# Patient Record
Sex: Male | Born: 1940 | Race: White | Hispanic: No | Marital: Married | Smoking: Never smoker
Health system: Southern US, Community
[De-identification: ages and names within clinical notes are randomized; demographics above are authoritative.]

## PROBLEM LIST (undated history)

## (undated) DIAGNOSIS — I2510 Atherosclerotic heart disease of native coronary artery without angina pectoris: Secondary | ICD-10-CM

## (undated) DIAGNOSIS — G4733 Obstructive sleep apnea (adult) (pediatric): Secondary | ICD-10-CM

## (undated) DIAGNOSIS — E538 Deficiency of other specified B group vitamins: Secondary | ICD-10-CM

## (undated) DIAGNOSIS — N529 Male erectile dysfunction, unspecified: Secondary | ICD-10-CM

## (undated) DIAGNOSIS — G930 Cerebral cysts: Secondary | ICD-10-CM

## (undated) DIAGNOSIS — I5189 Other ill-defined heart diseases: Secondary | ICD-10-CM

## (undated) DIAGNOSIS — K4090 Unilateral inguinal hernia, without obstruction or gangrene, not specified as recurrent: Secondary | ICD-10-CM

## (undated) DIAGNOSIS — I77810 Thoracic aortic ectasia: Secondary | ICD-10-CM

## (undated) DIAGNOSIS — D696 Thrombocytopenia, unspecified: Secondary | ICD-10-CM

## (undated) DIAGNOSIS — I10 Essential (primary) hypertension: Secondary | ICD-10-CM

## (undated) DIAGNOSIS — E785 Hyperlipidemia, unspecified: Secondary | ICD-10-CM

## (undated) DIAGNOSIS — I4729 Other ventricular tachycardia: Secondary | ICD-10-CM

## (undated) DIAGNOSIS — G8929 Other chronic pain: Secondary | ICD-10-CM

## (undated) DIAGNOSIS — R972 Elevated prostate specific antigen [PSA]: Secondary | ICD-10-CM

## (undated) DIAGNOSIS — K219 Gastro-esophageal reflux disease without esophagitis: Secondary | ICD-10-CM

## (undated) DIAGNOSIS — N400 Enlarged prostate without lower urinary tract symptoms: Secondary | ICD-10-CM

## (undated) DIAGNOSIS — I4891 Unspecified atrial fibrillation: Secondary | ICD-10-CM

## (undated) DIAGNOSIS — Z85828 Personal history of other malignant neoplasm of skin: Secondary | ICD-10-CM

## (undated) DIAGNOSIS — M1990 Unspecified osteoarthritis, unspecified site: Secondary | ICD-10-CM

## (undated) DIAGNOSIS — R519 Headache, unspecified: Secondary | ICD-10-CM

## (undated) HISTORY — DX: Personal history of other malignant neoplasm of skin: Z85.828

## (undated) HISTORY — PX: CARDIAC CATHETERIZATION: SHX172

## (undated) HISTORY — PX: HERNIA REPAIR: SHX51

## (undated) HISTORY — PX: OTHER SURGICAL HISTORY: SHX169

## (undated) HISTORY — PX: LEFT ATRIAL APPENDAGE OCCLUSION: SHX173A

---
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
## 2005-07-04 ENCOUNTER — Ambulatory Visit: Payer: Self-pay | Admitting: Gastroenterology

## 2009-03-19 ENCOUNTER — Ambulatory Visit: Payer: Self-pay | Admitting: Internal Medicine

## 2009-03-19 ENCOUNTER — Ambulatory Visit: Payer: Self-pay | Admitting: Surgery

## 2009-03-25 ENCOUNTER — Ambulatory Visit: Payer: Self-pay | Admitting: Surgery

## 2010-04-23 ENCOUNTER — Observation Stay: Payer: Self-pay | Admitting: Internal Medicine

## 2010-04-25 ENCOUNTER — Emergency Department: Payer: Self-pay | Admitting: Emergency Medicine

## 2011-11-10 ENCOUNTER — Ambulatory Visit: Payer: Self-pay | Admitting: Internal Medicine

## 2011-11-15 ENCOUNTER — Ambulatory Visit: Payer: Self-pay | Admitting: Unknown Physician Specialty

## 2011-11-15 LAB — CREATININE, SERUM
EGFR (African American): >60
EGFR (Non-African Amer.): >60

## 2011-11-21 ENCOUNTER — Ambulatory Visit: Payer: Self-pay | Admitting: Anesthesiology

## 2011-11-22 ENCOUNTER — Ambulatory Visit: Payer: Self-pay | Admitting: Unknown Physician Specialty

## 2011-11-24 LAB — PATHOLOGY REPORT

## 2011-11-29 ENCOUNTER — Ambulatory Visit: Payer: Self-pay | Admitting: Internal Medicine

## 2011-11-29 LAB — CREATININE, SERUM
Creatinine: 1.04 mg/dL (ref 0.60–1.30)
EGFR (African American): >60
EGFR (Non-African Amer.): >60

## 2012-02-19 ENCOUNTER — Emergency Department: Payer: Self-pay | Admitting: Emergency Medicine

## 2012-02-19 LAB — COMPREHENSIVE METABOLIC PANEL
Albumin: 4.6 g/dL (ref 3.4–5.0)
Calcium, Total: 9.4 mg/dL (ref 8.5–10.1)
Chloride: 103 mmol/L (ref 98–107)
Glucose: 104 mg/dL — ABNORMAL HIGH (ref 65–99)
Potassium: 3.4 mmol/L — ABNORMAL LOW (ref 3.5–5.1)
SGOT(AST): 31 U/L (ref 15–37)
SGPT (ALT): 35 U/L
Total Protein: 8.5 g/dL — ABNORMAL HIGH (ref 6.4–8.2)

## 2012-02-19 LAB — CBC
HCT: 47.6 % (ref 40.0–52.0)
HGB: 16.3 g/dL (ref 13.0–18.0)
MCH: 31.3 pg (ref 26.0–34.0)
RDW: 13 % (ref 11.5–14.5)
WBC: 7.8 10*3/uL (ref 3.8–10.6)

## 2012-02-20 ENCOUNTER — Ambulatory Visit: Payer: Self-pay | Admitting: Gastroenterology

## 2012-06-08 DIAGNOSIS — R972 Elevated prostate specific antigen [PSA]: Secondary | ICD-10-CM | POA: Insufficient documentation

## 2012-06-08 DIAGNOSIS — R3915 Urgency of urination: Secondary | ICD-10-CM | POA: Insufficient documentation

## 2012-06-11 DIAGNOSIS — D414 Neoplasm of uncertain behavior of bladder: Secondary | ICD-10-CM | POA: Insufficient documentation

## 2013-08-30 DIAGNOSIS — R6882 Decreased libido: Secondary | ICD-10-CM | POA: Insufficient documentation

## 2014-06-19 DIAGNOSIS — R339 Retention of urine, unspecified: Secondary | ICD-10-CM | POA: Insufficient documentation

## 2015-01-11 NOTE — Op Note (Signed)
PATIENT NAME:  Elijah Nelson, Elijah Nelson MR#:  [id number] DATE OF BIRTH:  August 23, 1941  DATE OF PROCEDURE:  11/22/2011  PREOPERATIVE DIAGNOSIS: Right lower neck supraclavicular mass.   POSTOPERATIVE DIAGNOSIS: Right lower neck supraclavicular mass.   OPERATION PERFORMED: Excision of right lower neck mass.   SURGEON: Roena Malady, M.D.   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 There was normal clavicular head to the sternocleidomastoid muscle.   DESCRIPTION OF PROCEDURE: Elijah Nelson was identified in the holding area and taken to the Operating Room and placed in the supine position. After laryngeal mask anesthesia, the neck was prepped and draped sterilely. An incision line was marked over the supraclavicular mass. A local anesthetic of 1% lidocaine with 1:100,000 epinephrine was used to inject over the area. A total of 1.5 to 2 mL was used. With the neck prepped and draped sterilely, a 15 blade was used to incise down to and through the platysma muscle. Hemostasis was achieved using the Bovie cautery. There was a small fatty tumor there consistent with a lipoma, which was excised in its entirety. Once this was removed, careful palpation beneath this did show what felt like a hypertrophy of the clavicular head and sternocleidomastoid muscle. With gentle dissection into the belly of the muscle I could see no evidence of tumor or mass. The muscle appeared normal. Dr. Mauri Pole who had also reviewed his films came in, palpated the area with me and agreed there was no further mass identifiable. The wound was then copiously irrigated. Reinspection again showed no evidence of residual palpable mass. The subcutaneous tissues were then closed using interrupted 4-0 Vicryl, and the skin was closed using Dermabond. The patient was returned to anesthesia where he was awakened in the Operating Room and taken to the Recovery Room in stable condition.   CULTURES: None.        SPECIMEN: Probable lipoma.   ESTIMATED BLOOD LOSS: Less than 5 mL. ____________________________ Roena Malady, MD ctm:slb D: 11/22/2011 14:38:24 ET T: 11/22/2011 16:25:38 ET JOB#: [id number]  cc: Roena Malady, MD, <Dictator> Roena Malady MD ELECTRONICALLY SIGNED 12/16/2011 8:38

## 2015-07-10 ENCOUNTER — Ambulatory Visit: Payer: Medicare Other | Attending: Neurology

## 2015-07-10 DIAGNOSIS — G4733 Obstructive sleep apnea (adult) (pediatric): Secondary | ICD-10-CM | POA: Insufficient documentation

## 2016-02-17 ENCOUNTER — Ambulatory Visit: Payer: Medicare Other | Attending: Neurology

## 2016-02-17 DIAGNOSIS — G4733 Obstructive sleep apnea (adult) (pediatric): Secondary | ICD-10-CM | POA: Insufficient documentation

## 2016-12-05 DIAGNOSIS — E538 Deficiency of other specified B group vitamins: Secondary | ICD-10-CM | POA: Insufficient documentation

## 2016-12-05 DIAGNOSIS — I10 Essential (primary) hypertension: Secondary | ICD-10-CM | POA: Insufficient documentation

## 2017-09-18 ENCOUNTER — Encounter: Payer: Self-pay | Admitting: Emergency Medicine

## 2017-09-18 ENCOUNTER — Emergency Department: Payer: Medicare Other

## 2017-09-18 ENCOUNTER — Observation Stay
Admission: EM | Admit: 2017-09-18 | Discharge: 2017-09-19 | Disposition: A | Discharge status: Home / Self Care | Payer: Medicare Other | Attending: Internal Medicine | Admitting: Internal Medicine

## 2017-09-18 DIAGNOSIS — G4733 Obstructive sleep apnea (adult) (pediatric): Secondary | ICD-10-CM | POA: Diagnosis not present

## 2017-09-18 DIAGNOSIS — K219 Gastro-esophageal reflux disease without esophagitis: Secondary | ICD-10-CM | POA: Diagnosis present

## 2017-09-18 DIAGNOSIS — I4891 Unspecified atrial fibrillation: Secondary | ICD-10-CM | POA: Diagnosis present

## 2017-09-18 DIAGNOSIS — E785 Hyperlipidemia, unspecified: Secondary | ICD-10-CM | POA: Insufficient documentation

## 2017-09-18 DIAGNOSIS — Z79899 Other long term (current) drug therapy: Secondary | ICD-10-CM | POA: Insufficient documentation

## 2017-09-18 DIAGNOSIS — R0789 Other chest pain: Secondary | ICD-10-CM | POA: Insufficient documentation

## 2017-09-18 DIAGNOSIS — I10 Essential (primary) hypertension: Secondary | ICD-10-CM | POA: Diagnosis not present

## 2017-09-18 DIAGNOSIS — R079 Chest pain, unspecified: Secondary | ICD-10-CM

## 2017-09-18 DIAGNOSIS — R51 Headache: Secondary | ICD-10-CM | POA: Diagnosis not present

## 2017-09-18 HISTORY — DX: Gastro-esophageal reflux disease without esophagitis: K21.9

## 2017-09-18 HISTORY — DX: Unspecified atrial fibrillation: I48.91

## 2017-09-18 HISTORY — DX: Obstructive sleep apnea (adult) (pediatric): G47.33

## 2017-09-18 LAB — BASIC METABOLIC PANEL
Anion gap: 10 (ref 5–15)
BUN: 12 mg/dL (ref 6–20)
CHLORIDE: 104 mmol/L (ref 101–111)
CO2: 26 mmol/L (ref 22–32)
CREATININE: 0.87 mg/dL (ref 0.61–1.24)
Calcium: 9 mg/dL (ref 8.9–10.3)
GFR calc non Af Amer: >60 mL/min (ref >60)
Glucose, Bld: 103 mg/dL — ABNORMAL HIGH (ref 65–99)
POTASSIUM: 3.6 mmol/L (ref 3.5–5.1)
SODIUM: 140 mmol/L (ref 135–145)

## 2017-09-18 LAB — CBC
HCT: 42.8 % (ref 40.0–52.0)
Hemoglobin: 14.6 g/dL (ref 13.0–18.0)
MCH: 31.9 pg (ref 26.0–34.0)
MCHC: 34.2 g/dL (ref 32.0–36.0)
MCV: 93.3 fL (ref 80.0–100.0)
PLATELETS: 162 10*3/uL (ref 150–440)
RBC: 4.59 MIL/uL (ref 4.40–5.90)
RDW: 14.1 % (ref 11.5–14.5)
WBC: 8 10*3/uL (ref 3.8–10.6)

## 2017-09-18 LAB — TROPONIN I: Troponin I: <0.03 ng/mL (ref <0.03)

## 2017-09-18 MED ORDER — DILTIAZEM HCL 25 MG/5ML IV SOLN
15.0000 mg | Freq: Once | INTRAVENOUS | Status: AC
  Administered 2017-09-18: 15 mg via INTRAVENOUS
  Filled 2017-09-18: qty 5

## 2017-09-18 NOTE — ED Notes (Signed)
X-ray at bedside

## 2017-09-18 NOTE — ED Provider Notes (Signed)
[facility] Emergency Department Provider Note  ____________________________________________   I have reviewed the triage vital signs and the nursing notes.   HISTORY  Chief Complaint Chest Pain   History limited by: Not Limited   HPI Elijah Nelson is a 76 y.o. male who presents to the emergency department today because of concern for chest pain.   LOCATION:center upper chest DURATION:3 days TIMING: intermittent QUALITY: pressure CONTEXT: patient states that for the past three days he has been having chest pressure. Located in the upper chest. Thought initially it might be heartburn given poor diet around the holidays. Today however noted that his pulse was fast and irregular. States he had history of afib roughly 10 years ago. Has not had any problem since then. No new medications. MODIFYING FACTORS: none ASSOCIATED SYMPTOMS: pain radiates to shoulders  Per medical record review patient has a history of GERD  Past Medical History:  Diagnosis Date  . Atrial fibrillation ([place])   . GERD (gastroesophageal reflux disease)   . OSA (obstructive sleep apnea)     There are no active problems to display for this patient.   Past Surgical History:  Procedure Laterality Date  . HERNIA REPAIR      Prior to Admission medications   Medication Sig Start Date End Date Taking? Authorizing Provider  tamsulosin (FLOMAX) 0.4 MG CAPS capsule Take 0.4 mg by mouth daily. 07/21/17   [provider]    Allergies Patient has no known allergies.  History reviewed. No pertinent family history.  Social History Social History   Tobacco Use  . Smoking status: Never Smoker  . Smokeless tobacco: Never Used  Substance Use Topics  . Alcohol use: No    Frequency: Never  . Drug use: Not on file    Review of Systems Constitutional: No fever/chills Eyes: No visual changes. ENT: No sore throat. Cardiovascular: Positive for chest pain. Respiratory:  Denies shortness of breath. Gastrointestinal: No abdominal pain.  No nausea, no vomiting.  No diarrhea.   Genitourinary: Negative for dysuria. Musculoskeletal: Negative for back pain. Skin: Negative for rash. Neurological: Positive for chronic headaches.  ____________________________________________   PHYSICAL EXAM:  VITAL SIGNS: ED Triage Vitals  Enc Vitals Group     BP 09/18/17 2204 (!) 133/93     Pulse Rate 09/18/17 2204 (!) 128     Resp 09/18/17 2204 17     Temp 09/18/17 2204 98.1 F (36.7 C)     Temp Source 09/18/17 2204 Oral     SpO2 09/18/17 2204 97 %     Weight 09/18/17 2205 204 lb (92.5 kg)     Height 09/18/17 2205 6\' 3"  (1.905 m)    Constitutional: Alert and oriented. Well appearing and in no distress. Eyes: Conjunctivae are normal.  ENT   Head: Normocephalic and atraumatic.   Nose: No congestion/rhinnorhea.   Mouth/Throat: Mucous membranes are moist.   Neck: No stridor. Hematological/Lymphatic/Immunilogical: No cervical lymphadenopathy. Cardiovascular: Tachycardic. Irregularly irregular rhythm.  No murmurs, rubs, or gallops. Respiratory: Normal respiratory effort without tachypnea nor retractions. Breath sounds are clear and equal bilaterally. No wheezes/rales/rhonchi. Gastrointestinal: Soft and non tender. No rebound. No guarding.  Genitourinary: Deferred Musculoskeletal: Normal range of motion in all extremities. No lower extremity edema. Neurologic:  Normal speech and language. No gross focal neurologic deficits are appreciated.  Skin:  Skin is warm, dry and intact. No rash noted. Psychiatric: Mood and affect are normal. Speech and behavior are normal. Patient exhibits appropriate insight and judgment.  ____________________________________________    LABS (pertinent positives/negatives)  Trop <0.03 CBC wnl BMP wnl except glu 103  ____________________________________________   EKG  I, Nance Pear, attending physician, personally  viewed and interpreted this EKG  EKG Time: 2201 Rate: 128 Rhythm: atrial fibrillation with RVR Axis: normal Intervals: qtc 449 QRS: narrow ST changes: no st elevation Impression: abnormal ekg  ____________________________________________    RADIOLOGY  CXR No acute disease  ____________________________________________   PROCEDURES  Procedures  ____________________________________________   INITIAL IMPRESSION / ASSESSMENT AND PLAN / ED COURSE  Pertinent labs & imaging results that were available during my care of the patient were reviewed by me and considered in my medical decision making (see chart for details).  Patient presented to the emergency department today because of concerns for chest pressure.  Differential would be broad including cardiac disease, arrhythmia, pneumonia, pneumothorax amongst other etiologies.  EKG does show atrial fibrillation with RVR.  Patient states he did have a distant history of atrial fibrillation.  Blood work does not show any signs of cardiac ischemia.  Electrolytes within normal limits.  Patient's heart rate did improve after diltiazem.  Will plan on admission.  Discussed findings and plan with patient.   ____________________________________________   FINAL CLINICAL IMPRESSION(S) / ED DIAGNOSES  Final diagnoses:  Chest pain, unspecified type  Atrial fibrillation with rapid ventricular response ([place])     Note: This dictation was prepared with Dragon dictation. Any transcriptional errors that result from this process are unintentional     Nance Pear, MD 09/18/17 [phone]

## 2017-09-18 NOTE — ED Notes (Signed)
Pt states chest pressure x 3 days. States central chest, c/o bilat neck pain and shoulder pain. Alert, oriented, ambulatory around room. Wife took VS at home and found HR to be irregular.

## 2017-09-18 NOTE — ED Triage Notes (Addendum)
Pt c/o intermittent central chest pressure x3 days that radiates into both sides of neck and shoulders. Pt denies cardiac hx. Pt denies SOB, N/V. EKG show a-fib RVR

## 2017-09-19 ENCOUNTER — Other Ambulatory Visit: Payer: Self-pay

## 2017-09-19 DIAGNOSIS — I4891 Unspecified atrial fibrillation: Secondary | ICD-10-CM | POA: Diagnosis not present

## 2017-09-19 DIAGNOSIS — R0789 Other chest pain: Secondary | ICD-10-CM | POA: Diagnosis not present

## 2017-09-19 DIAGNOSIS — R51 Headache: Secondary | ICD-10-CM | POA: Diagnosis not present

## 2017-09-19 DIAGNOSIS — E785 Hyperlipidemia, unspecified: Secondary | ICD-10-CM | POA: Diagnosis not present

## 2017-09-19 DIAGNOSIS — Z79899 Other long term (current) drug therapy: Secondary | ICD-10-CM | POA: Diagnosis not present

## 2017-09-19 DIAGNOSIS — G4733 Obstructive sleep apnea (adult) (pediatric): Secondary | ICD-10-CM | POA: Diagnosis not present

## 2017-09-19 DIAGNOSIS — K219 Gastro-esophageal reflux disease without esophagitis: Secondary | ICD-10-CM | POA: Diagnosis not present

## 2017-09-19 DIAGNOSIS — I10 Essential (primary) hypertension: Secondary | ICD-10-CM | POA: Diagnosis not present

## 2017-09-19 LAB — BASIC METABOLIC PANEL
ANION GAP: 7 (ref 5–15)
BUN: 12 mg/dL (ref 6–20)
CO2: 25 mmol/L (ref 22–32)
Calcium: 8.8 mg/dL — ABNORMAL LOW (ref 8.9–10.3)
Chloride: 106 mmol/L (ref 101–111)
Creatinine, Ser: 0.95 mg/dL (ref 0.61–1.24)
Glucose, Bld: 108 mg/dL — ABNORMAL HIGH (ref 65–99)
POTASSIUM: 3.8 mmol/L (ref 3.5–5.1)
SODIUM: 138 mmol/L (ref 135–145)

## 2017-09-19 LAB — CBC
HEMATOCRIT: 42.8 % (ref 40.0–52.0)
HEMOGLOBIN: 14.6 g/dL (ref 13.0–18.0)
MCH: 31.6 pg (ref 26.0–34.0)
MCHC: 34.1 g/dL (ref 32.0–36.0)
MCV: 92.7 fL (ref 80.0–100.0)
Platelets: 147 10*3/uL — ABNORMAL LOW (ref 150–440)
RBC: 4.62 MIL/uL (ref 4.40–5.90)
RDW: 13.8 % (ref 11.5–14.5)
WBC: 6.8 10*3/uL (ref 3.8–10.6)

## 2017-09-19 LAB — TROPONIN I: Troponin I: <0.03 ng/mL (ref <0.03)

## 2017-09-19 MED ORDER — APIXABAN 5 MG PO TABS
5.0000 mg | ORAL_TABLET | Freq: Two times a day (BID) | ORAL | Status: DC
  Administered 2017-09-19: 5 mg via ORAL
  Filled 2017-09-19: qty 1

## 2017-09-19 MED ORDER — ONDANSETRON HCL 4 MG/2ML IJ SOLN: 4.0000 mg | Freq: Four times a day (QID) | INTRAMUSCULAR | Status: DC | PRN

## 2017-09-19 MED ORDER — TAMSULOSIN HCL 0.4 MG PO CAPS
0.4000 mg | ORAL_CAPSULE | Freq: Every day | ORAL | Status: DC
  Administered 2017-09-19: 0.4 mg via ORAL
  Filled 2017-09-19: qty 1

## 2017-09-19 MED ORDER — APIXABAN 5 MG PO TABS: 5.0000 mg | ORAL_TABLET | Freq: Two times a day (BID) | ORAL | 0 refills | Status: DC

## 2017-09-19 MED ORDER — DILTIAZEM HCL 30 MG PO TABS
30.0000 mg | ORAL_TABLET | Freq: Three times a day (TID) | ORAL | Status: DC
  Administered 2017-09-19: 30 mg via ORAL
  Filled 2017-09-19: qty 1

## 2017-09-19 MED ORDER — DILTIAZEM HCL 30 MG PO TABS: 30.0000 mg | ORAL_TABLET | Freq: Three times a day (TID) | ORAL | Status: DC

## 2017-09-19 MED ORDER — SODIUM CHLORIDE 0.9% FLUSH
3.0000 mL | Freq: Two times a day (BID) | INTRAVENOUS | Status: DC
  Administered 2017-09-19: 3 mL via INTRAVENOUS

## 2017-09-19 MED ORDER — PANTOPRAZOLE SODIUM 40 MG PO TBEC
40.0000 mg | DELAYED_RELEASE_TABLET | Freq: Every day | ORAL | Status: DC
  Administered 2017-09-19: 40 mg via ORAL
  Filled 2017-09-19: qty 1

## 2017-09-19 MED ORDER — ONDANSETRON HCL 4 MG PO TABS: 4.0000 mg | ORAL_TABLET | Freq: Four times a day (QID) | ORAL | Status: DC | PRN

## 2017-09-19 MED ORDER — ACETAMINOPHEN 325 MG PO TABS: 650.0000 mg | ORAL_TABLET | Freq: Four times a day (QID) | ORAL | Status: DC | PRN

## 2017-09-19 MED ORDER — ACETAMINOPHEN 650 MG RE SUPP: 650.0000 mg | Freq: Four times a day (QID) | RECTAL | Status: DC | PRN

## 2017-09-19 MED ORDER — ENOXAPARIN SODIUM 40 MG/0.4ML ~~LOC~~ SOLN
40.0000 mg | SUBCUTANEOUS | Status: DC
  Administered 2017-09-19: 40 mg via SUBCUTANEOUS
  Filled 2017-09-19: qty 0.4

## 2017-09-19 MED ORDER — DILTIAZEM HCL ER COATED BEADS 180 MG PO CP24
180.0000 mg | ORAL_CAPSULE | Freq: Every day | ORAL | Status: DC
  Administered 2017-09-19: 180 mg via ORAL
  Filled 2017-09-19: qty 1

## 2017-09-19 MED ORDER — DILTIAZEM HCL ER COATED BEADS 180 MG PO CP24: 180.0000 mg | ORAL_CAPSULE | Freq: Every day | ORAL | 0 refills | Status: DC

## 2017-09-19 MED ORDER — ASPIRIN EC 81 MG PO TBEC
81.0000 mg | DELAYED_RELEASE_TABLET | Freq: Every day | ORAL | Status: DC
  Administered 2017-09-19: 81 mg via ORAL
  Filled 2017-09-19: qty 1

## 2017-09-19 MED ORDER — SIMVASTATIN 20 MG PO TABS
40.0000 mg | ORAL_TABLET | Freq: Every day | ORAL | Status: DC
  Administered 2017-09-19: 40 mg via ORAL
  Filled 2017-09-19: qty 2

## 2017-09-19 NOTE — Care Management (Signed)
Provided with Eliquis coupon.  Patient verbally confirms that he has pharmacy coverage with his medicare policy

## 2017-09-19 NOTE — Progress Notes (Signed)
Admitted for atrial fibrillation with RVR, discharging home today with Eliquis, Cardizem 180 mg daily, discussed with Dr. Clayborn Bigness, patient feels better and wants to go home.  Discharge medications are called into [facility].

## 2017-09-19 NOTE — Plan of Care (Signed)
  Progressing Education: Knowledge of General Education information will improve 09/19/2017 0410 - Progressing by Jeri Cos, RN Clinical Measurements: Ability to maintain clinical measurements within normal limits will improve 09/19/2017 0410 - Progressing by Jeri Cos, RN Will remain free from infection 09/19/2017 0410 - Progressing by Jeri Cos, RN Diagnostic test results will improve 09/19/2017 0410 - Progressing by Jeri Cos, RN Activity: Risk for activity intolerance will decrease 09/19/2017 0410 - Progressing by Jeri Cos, RN Nutrition: Adequate nutrition will be maintained 09/19/2017 0410 - Progressing by Jeri Cos, RN Pain Managment: General experience of comfort will improve 09/19/2017 0410 - Progressing by Jeri Cos, RN Safety: Ability to remain free from injury will improve 09/19/2017 0410 - Progressing by Jeri Cos, RN Education: Knowledge of disease or condition will improve 09/19/2017 0410 - Progressing by Jeri Cos, RN Understanding of medication regimen will improve 09/19/2017 0410 - Progressing by Jeri Cos, RN Cardiac: Ability to achieve and maintain adequate cardiopulmonary perfusion will improve 09/19/2017 0410 - Progressing by Jeri Cos, RN

## 2017-09-19 NOTE — Care Management Obs Status (Signed)
[facility] NOTIFICATION   Patient Details  Name: Elijah Nelson MRN: [id number] Date of Birth: [birth year]   Medicare Observation Status Notification Given:  No Discharge order placed in < 24hr of being placed on observation   Katrina Stack, RN 09/19/2017, 11:26 AM

## 2017-09-19 NOTE — Consult Note (Signed)
Reason for Consult: Atrial fibrillation angina Referring Physician: Dr. Kerrin Mo primary. Hospitalist Anselm Jungling  Elijah Nelson is an 77 y.o. male.  HPI: 77 year old white male history of headaches hypertension obstructive sleep apnea states to have had recurrent chest pain possible anginal symptoms over the last several weeks to days.  Patient got progressively worse felt poorly after prompting from his family finally sought medical attention and was found to be in rapid atrial fibrillation.  Patient had noted palpitations and tachycardia no previous history except for may be 8 years ago after a insect bite.  Patient has done relatively well recently except for persistent recurrent headaches on this admission he was found to have atrial fibrillation he complained of midsternal chest discomfort pressure burning sensation radiating to the back.  Denies any recent cardiac evaluation.  Last workup was about 8 years ago patient now here for further assessment chest pain is somewhat better atrial fibrillation rate controlled  Past Medical History:  Diagnosis Date  . Atrial fibrillation ([place])   . GERD (gastroesophageal reflux disease)   . OSA (obstructive sleep apnea)     Past Surgical History:  Procedure Laterality Date  . HERNIA REPAIR      Family History  Problem Relation Age of Onset  . Breast cancer Mother   . Heart attack Father   . Heart disease Father     Social History:  reports that  has never smoked. he has never used smokeless tobacco. He reports that he drinks about 2.4 oz of alcohol per week. He reports that he does not use drugs.  Allergies: No Known Allergies  Medications: Please see medication list  Results for orders placed or performed during the hospital encounter of 09/18/17 (from the past 48 hour(s))  Basic metabolic panel     Status: Abnormal   Collection Time: 09/18/17 10:00 PM  Result Value Ref Range   Sodium 140 135 - 145 mmol/L   Potassium 3.6 3.5 - 5.1  mmol/L   Chloride 104 101 - 111 mmol/L   CO2 26 22 - 32 mmol/L   Glucose, Bld 103 (H) 65 - 99 mg/dL   BUN 12 6 - 20 mg/dL   Creatinine, Ser 0.87 0.61 - 1.24 mg/dL   Calcium 9.0 8.9 - 10.3 mg/dL   GFR calc non Af Amer >60 >60 mL/min   GFR calc Af Amer >60 >60 mL/min    Comment: (NOTE) The eGFR has been calculated using the CKD EPI equation. This calculation has not been validated in all clinical situations. eGFR's persistently <60 mL/min signify possible Chronic Kidney Disease.    Anion gap 10 5 - 15    Comment: Performed at [facility], [address] [place], [place] [address]  CBC     Status: None   Collection Time: 09/18/17 10:00 PM  Result Value Ref Range   WBC 8.0 3.8 - 10.6 K/uL   RBC 4.59 4.40 - 5.90 MIL/uL   Hemoglobin 14.6 13.0 - 18.0 g/dL   HCT 42.8 40.0 - 52.0 %   MCV 93.3 80.0 - 100.0 fL   MCH 31.9 26.0 - 34.0 pg   MCHC 34.2 32.0 - 36.0 g/dL   RDW 14.1 11.5 - 14.5 %   Platelets 162 150 - 440 K/uL    Comment: Performed at [facility], [address] [place], [place] [address]  Troponin I     Status: None   Collection Time: 09/18/17 10:00 PM  Result Value Ref Range  Troponin I <0.03 <0.03 ng/mL    Comment: Performed at [facility], [address] [place], [place] [address]  Troponin I     Status: None   Collection Time: 09/19/17  1:44 AM  Result Value Ref Range   Troponin I <0.03 <0.03 ng/mL    Comment: Performed at [facility], [address] [place], [place] [address]  Troponin I     Status: None   Collection Time: 09/19/17  7:28 AM  Result Value Ref Range   Troponin I <0.03 <0.03 ng/mL    Comment: Performed at [facility], [address] [place], [place] [address]  Basic metabolic panel     Status: Abnormal   Collection Time: 09/19/17  7:28 AM  Result Value Ref Range   Sodium 138 135 - 145 mmol/L   Potassium 3.8 3.5 - 5.1 mmol/L   Chloride 106 101 - 111 mmol/L   CO2 25 22 - 32 mmol/L    Glucose, Bld 108 (H) 65 - 99 mg/dL   BUN 12 6 - 20 mg/dL   Creatinine, Ser 0.95 0.61 - 1.24 mg/dL   Calcium 8.8 (L) 8.9 - 10.3 mg/dL   GFR calc non Af Amer >60 >60 mL/min   GFR calc Af Amer >60 >60 mL/min    Comment: (NOTE) The eGFR has been calculated using the CKD EPI equation. This calculation has not been validated in all clinical situations. eGFR's persistently <60 mL/min signify possible Chronic Kidney Disease.    Anion gap 7 5 - 15    Comment: Performed at [facility], [address] [place], [place] [address]  CBC     Status: Abnormal   Collection Time: 09/19/17  7:28 AM  Result Value Ref Range   WBC 6.8 3.8 - 10.6 K/uL   RBC 4.62 4.40 - 5.90 MIL/uL   Hemoglobin 14.6 13.0 - 18.0 g/dL   HCT 42.8 40.0 - 52.0 %   MCV 92.7 80.0 - 100.0 fL   MCH 31.6 26.0 - 34.0 pg   MCHC 34.1 32.0 - 36.0 g/dL   RDW 13.8 11.5 - 14.5 %   Platelets 147 (L) 150 - 440 K/uL    Comment: Performed at [facility], [address], [place], [place] [address]    Dg Chest Port 1 View  Result Date: 09/18/2017 CLINICAL DATA:  Chest pressure x3 days. EXAM: PORTABLE CHEST 1 VIEW COMPARISON:  04/25/2010 FINDINGS: Borderline cardiomegaly with slight uncoiling of the thoracic aorta. No pneumonic consolidation or CHF. No effusion or pneumothorax. Mild degenerate change of the AC joints. No acute nor suspicious osseous abnormalities. IMPRESSION: No active disease. Electronically Signed   By: Ashley Royalty M.D.   On: 09/18/2017 22:24    Review of Systems  Constitutional: Positive for malaise/fatigue.  HENT: Negative.   Eyes: Negative.   Respiratory: Positive for shortness of breath.   Cardiovascular: Positive for chest pain and palpitations.  Gastrointestinal: Positive for heartburn.  Genitourinary: Negative.   Musculoskeletal: Negative.   Skin: Negative.   Neurological: Positive for dizziness, weakness and headaches.  Endo/Heme/Allergies: Negative.   Psychiatric/Behavioral: Negative.     Blood pressure 119/72, pulse 99, temperature 98.2 F (36.8 C), temperature source Oral, resp. rate 14, height _0  (1.905 m), weight 209 lb 11.2 oz (95.1 kg), SpO2 96 %. Physical Exam  Nursing note and vitals reviewed. Constitutional: He is oriented to person, place, and time. He appears well-developed and well-nourished.  HENT:  Head: Normocephalic and atraumatic.  Eyes: Conjunctivae and EOM are normal. Pupils are equal, round, and reactive to light.  Neck: Normal range of motion. Neck supple.  Cardiovascular: S1 normal, S2 normal and normal pulses. An irregularly irregular rhythm present. Tachycardia present. Exam reveals gallop and S3.  Murmur heard.  Systolic murmur is present with a grade of 2/6. Respiratory: Effort normal. He has wheezes.  GI: Soft. Bowel sounds are normal.  Musculoskeletal: Normal range of motion.  Neurological: He is alert and oriented to person, place, and time. He has normal reflexes.  Skin: Skin is warm and dry.  Psychiatric: He has a normal mood and affect.    Assessment/Plan: Chest pain Possible angina Atrial fibrillation Hypertension Tachycardia GERD Obstructive sleep apnea Headaches . PLAN Agree with admit to telemetry Rule out myocardial infarction with enzymes and EKG Agree with diltiazem for heart rate and blood pressure control Increase dose to 180 mg daily discontinue short acting Cardizem Hyperlipidemia maintain simvastatin therapy for lipid management Agree with Protonix therapy for reflux symptoms Recommend anticoagulation with Eliquis twice a day because of A. Fib Recommend echocardiogram for further assessment evaluation proxy was as an outpatient Functional study for chest pain and angina can be done as an outpatient We will avoid nitrates because of headaches Sleep study CPAP for obstructive sleep apnea    Elijah Nelson 09/19/2017, 2:38 PM

## 2017-09-19 NOTE — H&P (Signed)
[facility] NAME: Elijah Nelson    MR#:  [id number]  DATE OF BIRTH:  05-15-1941  DATE OF ADMISSION:  09/18/2017  PRIMARY CARE PHYSICIAN: Katheren Shams   REQUESTING/REFERRING PHYSICIAN: Archie Balboa, MD  CHIEF COMPLAINT:   Chief Complaint  Patient presents with  . Chest Pain    HISTORY OF PRESENT ILLNESS:  Elijah Nelson  is a 77 y.o. male who presents with couple days of intermittent chest heaviness.  Patient presented to the ED with atrial fibrillation and RVR.  He states he had an episode of A. fib about 8 years ago when he was in the hospital.  He has not had any issues or sternal symptoms since that time.  Initial workup here in the ED is largely within normal limits other than the A. fib.  His A. fib was initially fairly responsive to nodal blocking agents.  Hospitalist were called for admission  PAST MEDICAL HISTORY:   Past Medical History:  Diagnosis Date  . Atrial fibrillation ([place])   . GERD (gastroesophageal reflux disease)   . OSA (obstructive sleep apnea)     PAST SURGICAL HISTORY:   Past Surgical History:  Procedure Laterality Date  . HERNIA REPAIR      SOCIAL HISTORY:   Social History   Tobacco Use  . Smoking status: Never Smoker  . Smokeless tobacco: Never Used  Substance Use Topics  . Alcohol use: Yes    Alcohol/week: 2.4 oz    Types: 4 Glasses of wine per week    Frequency: Never    FAMILY HISTORY:   Family History  Problem Relation Age of Onset  . Breast cancer Mother   . Heart attack Father   . Heart disease Father     DRUG ALLERGIES:  No Known Allergies  MEDICATIONS AT HOME:   Prior to Admission medications   Medication Sig Start Date End Date Taking? Authorizing Provider  tamsulosin (FLOMAX) 0.4 MG CAPS capsule Take 0.4 mg by mouth daily. 07/21/17   [provider]    REVIEW OF SYSTEMS:  Review of Systems  Constitutional: Negative for chills, fever,  malaise/fatigue and weight loss.  HENT: Negative for ear pain, hearing loss and tinnitus.   Eyes: Negative for blurred vision, double vision, pain and redness.  Respiratory: Negative for cough, hemoptysis and shortness of breath.   Cardiovascular: Positive for chest pain. Negative for palpitations, orthopnea and leg swelling.  Gastrointestinal: Negative for abdominal pain, constipation, diarrhea, nausea and vomiting.  Genitourinary: Negative for dysuria, frequency and hematuria.  Musculoskeletal: Negative for back pain, joint pain and neck pain.  Skin:       No acne, rash, or lesions  Neurological: Negative for dizziness, tremors, focal weakness and weakness.  Endo/Heme/Allergies: Negative for polydipsia. Does not bruise/bleed easily.  Psychiatric/Behavioral: Negative for depression. The patient is not nervous/anxious and does not have insomnia.      VITAL SIGNS:   Vitals:   09/18/17 2204 09/18/17 2205 09/18/17 2213 09/18/17 2247  BP: (!) 133/93  119/78 120/88  Pulse: (!) 128  (!) 123 (!) 117  Resp: 17  17 (!) 22  Temp: 98.1 F (36.7 C)     TempSrc: Oral     SpO2: 97%  94% 92%  Weight:  92.5 kg (204 lb)    Height:  6\' 3"  (1.905 m)     Wt Readings from Last 3 Encounters:  09/18/17 92.5 kg (204 lb)    PHYSICAL EXAMINATION:  Physical Exam  Vitals reviewed. Constitutional: He is oriented to person, place, and time. He appears well-developed and well-nourished. No distress.  HENT:  Head: Normocephalic and atraumatic.  Mouth/Throat: Oropharynx is clear and moist.  Eyes: Conjunctivae and EOM are normal. Pupils are equal, round, and reactive to light. No scleral icterus.  Neck: Normal range of motion. Neck supple. No JVD present. No thyromegaly present.  Cardiovascular: Intact distal pulses. Exam reveals no gallop and no friction rub.  No murmur heard. Tachycardic, irregular rhythm  Respiratory: Effort normal and breath sounds normal. No respiratory distress. He has no wheezes.  He has no rales.  GI: Soft. Bowel sounds are normal. He exhibits no distension. There is no tenderness.  Musculoskeletal: Normal range of motion. He exhibits no edema.  No arthritis, no gout  Lymphadenopathy:    He has no cervical adenopathy.  Neurological: He is alert and oriented to person, place, and time. No cranial nerve deficit.  No dysarthria, no aphasia  Skin: Skin is warm and dry. No rash noted. No erythema.  Psychiatric: He has a normal mood and affect. His behavior is normal. Judgment and thought content normal.    LABORATORY PANEL:   CBC Recent Labs  Lab 09/18/17 2200  WBC 8.0  HGB 14.6  HCT 42.8  PLT 162   ------------------------------------------------------------------------------------------------------------------  Chemistries  Recent Labs  Lab 09/18/17 2200  NA 140  K 3.6  CL 104  CO2 26  GLUCOSE 103*  BUN 12  CREATININE 0.87  CALCIUM 9.0   ------------------------------------------------------------------------------------------------------------------  Cardiac Enzymes Recent Labs  Lab 09/18/17 2200  TROPONINI <0.03   ------------------------------------------------------------------------------------------------------------------  RADIOLOGY:  Dg Chest Port 1 View  Result Date: 09/18/2017 CLINICAL DATA:  Chest pressure x3 days. EXAM: PORTABLE CHEST 1 VIEW COMPARISON:  04/25/2010 FINDINGS: Borderline cardiomegaly with slight uncoiling of the thoracic aorta. No pneumonic consolidation or CHF. No effusion or pneumothorax. Mild degenerate change of the AC joints. No acute nor suspicious osseous abnormalities. IMPRESSION: No active disease. Electronically Signed   By: Ashley Royalty M.D.   On: 09/18/2017 22:24    EKG:   Orders placed or performed during the hospital encounter of 09/18/17  . EKG 12-Lead  . EKG 12-Lead  . EKG 12-Lead  . EKG 12-Lead  . ED EKG within 10 minutes  . ED EKG within 10 minutes    IMPRESSION AND PLAN:  Principal  Problem:   Atrial fibrillation with RVR (HCC) -will use nodal blocking agents tonight to get his rate down under 110.  Trend his cardiac enzymes, get an echocardiogram and a cardiology consult Active Problems:   Chest pain -see workup above   GERD (gastroesophageal reflux disease) -home dose PPI   OSA (obstructive sleep apnea) -CPAP nightly  All the records are reviewed and case discussed with ED provider. Management plans discussed with the patient and/or family.  DVT PROPHYLAXIS: SubQ lovenox  GI PROPHYLAXIS: PPI  ADMISSION STATUS: Observation  CODE STATUS: Full Code Status History    This patient does not have a recorded code status. Please follow your organizational policy for patients in this situation.      TOTAL TIME TAKING CARE OF THIS PATIENT: 40 minutes.   Towanna Avery [place] 09/19/2017, 12:17 AM  Elijah Nelson  Office  [phone]  CC: Primary care physician; Katheren Shams  Note:  This document was prepared using Dragon voice recognition software and may include unintentional dictation errors.

## 2017-09-19 NOTE — Plan of Care (Signed)
He and his wife are aware of follow up care.  And express compliance with the plan.

## 2017-09-24 NOTE — Discharge Summary (Signed)
Elijah Nelson, is a 77 y.o. male  DOB [birth year]  MRN [id number].  Admission date:  09/18/2017  Admitting Physician  Lance Coon, MD  Discharge Date:  09/19/2017   Primary MD  Katheren Shams  Recommendations for primary care physician for things to follow:   Follow-up with PCP in 1 week Follow-up with Dr. Clayborn Bigness from cardiology in 1-2 days   Admission Diagnosis  Atrial fibrillation with rapid ventricular response ([place]) [I48.91] Chest pain [R07.9] Chest pain, unspecified type [R07.9]   Discharge Diagnosis  Atrial fibrillation with rapid ventricular response ([place]) [I48.91] Chest pain [R07.9] Chest pain, unspecified type [R07.9]    Principal Problem:   Atrial fibrillation with RVR (HCC) Active Problems:   Chest pain   GERD (gastroesophageal reflux disease)   OSA (obstructive sleep apnea)      Past Medical History:  Diagnosis Date  . Atrial fibrillation ([place])   . GERD (gastroesophageal reflux disease)   . OSA (obstructive sleep apnea)     Past Surgical History:  Procedure Laterality Date  . HERNIA REPAIR         History of present illness and  Hospital Course:     Kindly see H&P for history of present illness and admission details, please review complete Labs, Consult reports and Test reports for all details in brief  HPI  from the history and physical done on the day of admission Elijah Nelson  is a 77 y.o. male who presents with couple days of intermittent chest heaviness.  Patient presented to the ED with atrial fibrillation and RVR.  Heart rate around 128, admitted to hospital on telemetry  for A. fib with RVR.     Hospital Course  1 atrial fibrillation with RVR: Ruled out for MI.  seen by cardiology Dr. Call wood  recommended Cardizem CD 180 mg daily, discharge home with Eliquis.   Patient can have functional study for chest pain as an outpatient, discharged home in stable condition . 2.  Atypical chest pain likely due to atrial fibrillation with RVR: Troponins have been negative.  Did not have any chest pain after the heart rate has been controlled, discharged home  in stable condition. #3 GERD new home dose PPIs. 4.  Essential hypertension 5.  Obstructive sleep apnea: CPAP at night.   Discharge Condition:    Follow UP  Follow-up Information    Callwood, Karma Greaser D, MD Follow up in 1 day(s).   Specialties:  Cardiology, Internal Medicine Why:  Tomorrow at 8:15 AM. Contact information: [place] [address] [phone]             Discharge Instructions  and  Discharge Medications      Allergies as of 09/19/2017   No Known Allergies     Medication List    TAKE these medications   apixaban 5 MG Tabs tablet Commonly known as:  ELIQUIS Take 1 tablet (5 mg total) by mouth 2 (two) times daily.   aspirin 81 MG EC tablet Take 81 mg by mouth daily.   diltiazem 180 MG 24 hr capsule Commonly known as:  CARDIZEM CD Take 1 capsule (180 mg total) by mouth daily.   glucosamine-chondroitin 500-400 MG tablet Take 2 tablets by mouth daily. Notes to patient:  NONE GIVEN TODAY   omega-3 acid ethyl esters 1 g capsule Commonly known as:  LOVAZA Take 1 g by mouth daily. Notes to patient:  NONE GIVEN TODAY   omeprazole  20 MG capsule Commonly known as:  PRILOSEC Take 20 mg by mouth daily.   simvastatin 40 MG tablet Commonly known as:  ZOCOR Take 40 mg by mouth at bedtime.   tamsulosin 0.4 MG Caps capsule Commonly known as:  FLOMAX Take 0.4 mg by mouth every other day.         Diet and Activity recommendation: See Discharge Instructions above   Consults obtained - cardiology  Major procedures and Radiology Reports - PLEASE review detailed and final reports for all details, in brief -      Dg Chest Port 1 View  Result Date: 09/18/2017 CLINICAL DATA:  Chest pressure x3 days. EXAM: PORTABLE CHEST 1 VIEW COMPARISON:  04/25/2010 FINDINGS: Borderline cardiomegaly with slight uncoiling of the thoracic aorta. No pneumonic consolidation or CHF. No effusion or pneumothorax. Mild degenerate change of the AC joints. No acute nor suspicious osseous abnormalities. IMPRESSION: No active disease. Electronically Signed   By: Ashley Royalty M.D.   On: 09/18/2017 22:24    Micro Results    No results found for this or any previous visit (from the past 240 hour(s)).     Today   Subjective:   Elijah Nelson today has no headache,no chest abdominal pain,no new weakness tingling or numbness, feels much better wants to go home today.   Objective:   Blood pressure 119/72, pulse 99, temperature 98.2 F (36.8 C), temperature source Oral, resp. rate 14, height 6\' 3"  (1.905 m), weight 95.1 kg (209 lb 11.2 oz), SpO2 96 %.  No intake or output data in the 24 hours ending 09/24/17 1324  Exam Awake Alert, Oriented x 3, No new F.N deficits, Normal affect [place].AT,PERRAL Supple Neck,No JVD, No cervical lymphadenopathy appriciated.  Symmetrical Chest wall movement, Good air movement bilaterally, CTAB RRR,No Gallops,Rubs or new Murmurs, No Parasternal Heave +ve B.Sounds, Abd Soft, Non tender, No organomegaly appriciated, No rebound -guarding or rigidity. No Cyanosis, Clubbing or edema, No new Rash or bruise  Data Review   CBC w Diff:  Lab Results  Component Value Date   WBC 6.8 09/19/2017   HGB 14.6 09/19/2017   HGB 16.3 02/19/2012   HCT 42.8 09/19/2017   HCT 47.6 02/19/2012   PLT 147 (L) 09/19/2017   PLT 175 02/19/2012    CMP:  Lab Results  Component Value Date   NA 138 09/19/2017   NA 142 02/19/2012   K 3.8 09/19/2017   K 3.4 (L) 02/19/2012   CL 106 09/19/2017   CL 103 02/19/2012   CO2 25 09/19/2017   CO2 26 02/19/2012   BUN 12 09/19/2017   BUN 14 02/19/2012   CREATININE  0.95 09/19/2017   CREATININE 1.04 02/19/2012   PROT 8.5 (H) 02/19/2012   ALBUMIN 4.6 02/19/2012   BILITOT 2.0 (H) 02/19/2012   ALKPHOS 126 02/19/2012   AST 31 02/19/2012   ALT 35 02/19/2012  .   Total Time in preparing paper work, data evaluation and todays exam - 35 minutes  Epifanio Lesches M.D on 09/19/2017 at 1:24 PM    Note: This dictation was prepared with Dragon dictation along with smaller phrase technology. Any transcriptional errors that result from this process are unintentional.

## 2018-05-18 ENCOUNTER — Ambulatory Visit: Payer: Self-pay | Admitting: Urology

## 2018-06-11 ENCOUNTER — Encounter: Payer: Self-pay | Admitting: Urology

## 2018-06-11 ENCOUNTER — Ambulatory Visit (INDEPENDENT_AMBULATORY_CARE_PROVIDER_SITE_OTHER): Payer: Medicare Other | Admitting: Urology

## 2018-06-11 VITALS — BP 123/72 | HR 85 | Ht 74.0 in | Wt 208.0 lb

## 2018-06-11 DIAGNOSIS — N401 Enlarged prostate with lower urinary tract symptoms: Secondary | ICD-10-CM

## 2018-06-11 DIAGNOSIS — Z87898 Personal history of other specified conditions: Secondary | ICD-10-CM

## 2018-06-12 NOTE — Progress Notes (Signed)
06/11/2018 7:27 AM   Elijah Nelson [birth year] [id number]  Referring provider: Katheren Shams [place],  [address]  Chief Complaint  Patient presents with  . Establish Care    HPI: 77 year old male presents to establish local urologic care.  He has been followed by Dr. Jacqlyn Larsen for greater than 10 years and last saw him at [facility] in April 2019.  He was not due for a follow-up until April 2020 however was made an appointment for today.  He has stable lower urinary tract symptoms.  He is on tamsulosin every other day and states his voiding pattern is not bothersome.  He has a history of an elevated PSA.  He underwent prostate biopsy in 2009 with benign pathology.  Available reviewed records do not mention the PSA level at the time his biopsy was performed.  His PSA between 2015 and 2019 has ranged from 3.3-3.93.  He denies dysuria or gross hematuria.  He denies flank, abdominal, pelvic or scrotal pain.   PMH: Past Medical History:  Diagnosis Date  . Atrial fibrillation ([place])   . GERD (gastroesophageal reflux disease)   . History of skin cancer   . OSA (obstructive sleep apnea)     Surgical History: Past Surgical History:  Procedure Laterality Date  . HERNIA REPAIR      Home Medications:  Allergies as of 06/11/2018      Reactions   Cyclobenzaprine    Other reaction(s): Headache, Other (See Comments) Altered mental status   Morphine    Other reaction(s): Other (See Comments), Other (See Comments) Altered mental status Altered mental status   Oxycodone-acetaminophen Other (See Comments)   Other reaction(s): Headache HEADACHE   Tadalafil Other (See Comments)   Other reaction(s): Headache With high doses With high doses      Medication List        Accurate as of 06/11/18 11:59 PM. Always use your most recent med list.          aspirin 81 MG EC tablet Take 81 mg by mouth daily.   atorvastatin 40 MG tablet Commonly  known as:  LIPITOR   glucosamine-chondroitin 500-400 MG tablet Take 2 tablets by mouth daily.   MULTI-VITAMINS Tabs Take by mouth.   omega-3 acid ethyl esters 1 g capsule Commonly known as:  LOVAZA Take 1 g by mouth daily.   omeprazole 20 MG capsule Commonly known as:  PRILOSEC Take 20 mg by mouth daily.   tamsulosin 0.4 MG Caps capsule Commonly known as:  FLOMAX Take 0.4 mg by mouth every other day.       Allergies:  Allergies  Allergen Reactions  . Cyclobenzaprine     Other reaction(s): Headache, Other (See Comments) Altered mental status   . Morphine     Other reaction(s): Other (See Comments), Other (See Comments) Altered mental status Altered mental status   . Oxycodone-Acetaminophen Other (See Comments)    Other reaction(s): Headache HEADACHE   . Tadalafil Other (See Comments)    Other reaction(s): Headache With high doses With high doses     Family History: Family History  Problem Relation Age of Onset  . Breast cancer Mother   . Heart attack Father   . Heart disease Father     Social History:  reports that he has never smoked. He has never used smokeless tobacco. He reports that he drinks about 4.0 standard drinks of alcohol per week. He reports that he does not use drugs.  ROS:  UROLOGY Frequent Urination?: No Hard to postpone urination?: No Burning/pain with urination?: No Get up at night to urinate?: Yes Leakage of urine?: No Urine stream starts and stops?: No Trouble starting stream?: No Do you have to strain to urinate?: No Blood in urine?: No Urinary tract infection?: No Sexually transmitted disease?: No Injury to kidneys or bladder?: No Painful intercourse?: No Weak stream?: Yes Erection problems?: Yes Penile pain?: No  Gastrointestinal Nausea?: No Vomiting?: No Indigestion/heartburn?: No Diarrhea?: No Constipation?: No  Constitutional Fever: No Night sweats?: No Weight loss?: No Fatigue?: No  Skin Skin  rash/lesions?: No Itching?: No  Eyes Blurred vision?: No Double vision?: No  Ears/Nose/Throat Sore throat?: No Sinus problems?: No  Hematologic/Lymphatic Swollen glands?: No Easy bruising?: No  Cardiovascular Leg swelling?: No Chest pain?: No  Respiratory Cough?: No Shortness of breath?: No  Endocrine Excessive thirst?: No  Musculoskeletal Back pain?: No Joint pain?: No  Neurological Headaches?: Yes Dizziness?: No  Psychologic Depression?: No Anxiety?: No  Physical Exam: BP 123/72 (BP Location: Left Arm, Patient Position: Sitting, Cuff Size: Large)   Pulse 85   Ht 6\' 2"  (1.88 m)   Wt 208 lb (94.3 kg)   BMI 26.71 kg/m   Constitutional:  Alert and oriented, No acute distress. HEENT: [place] AT, moist mucus membranes.  Trachea midline, no masses. Cardiovascular: No clubbing, cyanosis, or edema. Respiratory: Normal respiratory effort, no increased work of breathing. GI: Abdomen is soft, nontender, nondistended, no abdominal masses GU: No CVA tenderness.  Prostate 40 g, smooth without nodules Lymph: No cervical or inguinal lymphadenopathy. Skin: No rashes, bruises or suspicious lesions. Neurologic: Grossly intact, no focal deficits, moving all 4 extremities. Psychiatric: Normal mood and affect.   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  He did not need a refill of this medication.  He has a history of an elevated PSA.  DRE was benign.  Last PSA April 2019 was 3.93.  He will not be due for another PSA until April 2020.  I discussed AUA guidelines on prostate cancer screening and that PSA is not recommended after age is 23-75.  He requested that his PSA continued to be checked and he will return in April for a lab visit in 1 year for an office visit.    Abbie Sons, [place] [address] [place], [place]  [address] [phone]

## 2018-06-18 ENCOUNTER — Encounter: Payer: Self-pay | Admitting: Urology

## 2018-06-18 DIAGNOSIS — Z87898 Personal history of other specified conditions: Secondary | ICD-10-CM | POA: Insufficient documentation

## 2018-06-18 DIAGNOSIS — N401 Enlarged prostate with lower urinary tract symptoms: Secondary | ICD-10-CM | POA: Insufficient documentation

## 2018-06-20 ENCOUNTER — Other Ambulatory Visit: Payer: Self-pay | Admitting: Specialist

## 2018-06-20 DIAGNOSIS — R51 Headache: Principal | ICD-10-CM

## 2018-06-20 DIAGNOSIS — R519 Headache, unspecified: Secondary | ICD-10-CM

## 2018-06-20 DIAGNOSIS — M542 Cervicalgia: Secondary | ICD-10-CM

## 2018-06-22 ENCOUNTER — Other Ambulatory Visit: Payer: Medicare Other

## 2018-06-25 ENCOUNTER — Ambulatory Visit
Admission: RE | Admit: 2018-06-25 | Discharge: 2018-06-25 | Disposition: A | Discharge status: Home / Self Care | Payer: Medicare Other | Source: Ambulatory Visit | Attending: Specialist | Admitting: Specialist

## 2018-06-25 DIAGNOSIS — R51 Headache: Principal | ICD-10-CM

## 2018-06-25 DIAGNOSIS — R519 Headache, unspecified: Secondary | ICD-10-CM

## 2018-06-25 DIAGNOSIS — M542 Cervicalgia: Secondary | ICD-10-CM

## 2018-10-19 ENCOUNTER — Other Ambulatory Visit: Payer: Self-pay | Admitting: Urology

## 2018-10-19 NOTE — Telephone Encounter (Signed)
Pt wife called office asking for a refill on pt's flomax to be sent to fax # [phone] mail order pharmacy, [facility].

## 2018-10-23 MED ORDER — TAMSULOSIN HCL 0.4 MG PO CAPS: 0.4000 mg | ORAL_CAPSULE | ORAL | 1 refills | Status: DC

## 2018-11-02 ENCOUNTER — Other Ambulatory Visit: Payer: Self-pay | Admitting: Urology

## 2018-11-02 MED ORDER — TAMSULOSIN HCL 0.4 MG PO CAPS: 0.4000 mg | ORAL_CAPSULE | ORAL | 1 refills | Status: AC

## 2018-11-02 NOTE — Telephone Encounter (Signed)
Left message for patient to return call.

## 2018-11-02 NOTE — Telephone Encounter (Signed)
Pt's wife called and asked if her husband could get a rx refill for Tamsulosin. She states that the Fax # is [phone]. Thank you

## 2018-11-02 NOTE — Telephone Encounter (Signed)
Rx sent to pharmacy fax per wifes request

## 2018-12-31 ENCOUNTER — Other Ambulatory Visit: Payer: Medicare Other

## 2018-12-31 ENCOUNTER — Other Ambulatory Visit: Payer: Self-pay

## 2018-12-31 DIAGNOSIS — N401 Enlarged prostate with lower urinary tract symptoms: Secondary | ICD-10-CM

## 2018-12-31 DIAGNOSIS — Z87898 Personal history of other specified conditions: Secondary | ICD-10-CM

## 2019-01-02 ENCOUNTER — Encounter: Payer: Self-pay | Admitting: Urology

## 2019-06-14 ENCOUNTER — Ambulatory Visit: Payer: Medicare Other | Admitting: Urology

## 2019-07-19 ENCOUNTER — Other Ambulatory Visit
Admission: RE | Admit: 2019-07-19 | Discharge: 2019-07-19 | Disposition: A | Discharge status: Home / Self Care | Payer: Medicare Other | Source: Ambulatory Visit | Attending: Ophthalmology | Admitting: Ophthalmology

## 2019-07-19 ENCOUNTER — Other Ambulatory Visit: Payer: Self-pay

## 2019-07-19 DIAGNOSIS — Z20828 Contact with and (suspected) exposure to other viral communicable diseases: Secondary | ICD-10-CM | POA: Diagnosis not present

## 2019-07-19 DIAGNOSIS — Z01812 Encounter for preprocedural laboratory examination: Secondary | ICD-10-CM | POA: Insufficient documentation

## 2019-07-19 LAB — SARS CORONAVIRUS 2 (TAT 6-24 HRS): SARS Coronavirus 2: NEGATIVE

## 2019-07-21 DIAGNOSIS — Z9841 Cataract extraction status, right eye: Secondary | ICD-10-CM

## 2019-07-21 HISTORY — DX: Cataract extraction status, right eye: Z98.41

## 2019-07-22 NOTE — Discharge Instructions (Signed)

## 2019-07-23 ENCOUNTER — Ambulatory Visit
Admission: RE | Admit: 2019-07-23 | Discharge: 2019-07-23 | Disposition: A | Discharge status: Home / Self Care | Payer: Medicare Other | Attending: Ophthalmology | Admitting: Ophthalmology

## 2019-07-23 ENCOUNTER — Ambulatory Visit: Payer: Medicare Other | Admitting: Anesthesiology

## 2019-07-23 ENCOUNTER — Other Ambulatory Visit: Payer: Self-pay

## 2019-07-23 ENCOUNTER — Encounter
Admission: RE | Disposition: A | Discharge status: Home / Self Care | Payer: Self-pay | Source: Home / Self Care | Attending: Ophthalmology

## 2019-07-23 DIAGNOSIS — I4891 Unspecified atrial fibrillation: Secondary | ICD-10-CM | POA: Insufficient documentation

## 2019-07-23 DIAGNOSIS — R519 Headache, unspecified: Secondary | ICD-10-CM | POA: Insufficient documentation

## 2019-07-23 DIAGNOSIS — E7800 Pure hypercholesterolemia, unspecified: Secondary | ICD-10-CM | POA: Diagnosis not present

## 2019-07-23 DIAGNOSIS — G4730 Sleep apnea, unspecified: Secondary | ICD-10-CM | POA: Insufficient documentation

## 2019-07-23 DIAGNOSIS — H2511 Age-related nuclear cataract, right eye: Secondary | ICD-10-CM | POA: Diagnosis present

## 2019-07-23 DIAGNOSIS — Z885 Allergy status to narcotic agent status: Secondary | ICD-10-CM | POA: Diagnosis not present

## 2019-07-23 DIAGNOSIS — I499 Cardiac arrhythmia, unspecified: Secondary | ICD-10-CM | POA: Insufficient documentation

## 2019-07-23 DIAGNOSIS — Z85828 Personal history of other malignant neoplasm of skin: Secondary | ICD-10-CM | POA: Diagnosis not present

## 2019-07-23 DIAGNOSIS — K219 Gastro-esophageal reflux disease without esophagitis: Secondary | ICD-10-CM | POA: Diagnosis not present

## 2019-07-23 DIAGNOSIS — N400 Enlarged prostate without lower urinary tract symptoms: Secondary | ICD-10-CM | POA: Diagnosis not present

## 2019-07-23 HISTORY — DX: Benign prostatic hyperplasia without lower urinary tract symptoms: N40.0

## 2019-07-23 HISTORY — PX: CATARACT EXTRACTION W/PHACO: SHX586

## 2019-07-23 HISTORY — DX: Headache, unspecified: R51.9

## 2019-07-23 HISTORY — DX: Unspecified osteoarthritis, unspecified site: M19.90

## 2019-07-23 SURGERY — PHACOEMULSIFICATION, CATARACT, WITH IOL INSERTION
Anesthesia: Monitor Anesthesia Care | Site: Eye | Laterality: Right

## 2019-07-23 MED ORDER — EPINEPHRINE PF 1 MG/ML IJ SOLN
INTRAOCULAR | Status: DC | PRN
  Administered 2019-07-23: 79 mL via OPHTHALMIC

## 2019-07-23 MED ORDER — MOXIFLOXACIN HCL 0.5 % OP SOLN
OPHTHALMIC | Status: DC | PRN
  Administered 2019-07-23: 0.2 mL

## 2019-07-23 MED ORDER — NA CHONDROIT SULF-NA HYALURON 40-17 MG/ML IO SOLN
INTRAOCULAR | Status: DC | PRN
  Administered 2019-07-23: 1 mL via INTRAOCULAR

## 2019-07-23 MED ORDER — TETRACAINE HCL 0.5 % OP SOLN
1.0000 [drp] | OPHTHALMIC | Status: DC | PRN
  Administered 2019-07-23 (×3): 1 [drp] via OPHTHALMIC

## 2019-07-23 MED ORDER — LIDOCAINE HCL (PF) 2 % IJ SOLN
INTRAOCULAR | Status: DC | PRN
  Administered 2019-07-23: 1 mL

## 2019-07-23 MED ORDER — FENTANYL CITRATE (PF) 100 MCG/2ML IJ SOLN
INTRAMUSCULAR | Status: DC | PRN
  Administered 2019-07-23 (×2): 50 ug via INTRAVENOUS

## 2019-07-23 MED ORDER — MIDAZOLAM HCL 2 MG/2ML IJ SOLN
INTRAMUSCULAR | Status: DC | PRN
  Administered 2019-07-23 (×2): 1 mg via INTRAVENOUS

## 2019-07-23 MED ORDER — ARMC OPHTHALMIC DILATING DROPS
1.0000 "application " | OPHTHALMIC | Status: DC | PRN
  Administered 2019-07-23 (×3): 1 via OPHTHALMIC

## 2019-07-23 MED ORDER — BRIMONIDINE TARTRATE-TIMOLOL 0.2-0.5 % OP SOLN
OPHTHALMIC | Status: DC | PRN
  Administered 2019-07-23: 1 [drp] via OPHTHALMIC

## 2019-07-23 SURGICAL SUPPLY — 22 items
CANNULA ANT/CHMB 27G (MISCELLANEOUS) ×2 IMPLANT
CANNULA ANT/CHMB 27GA (MISCELLANEOUS) ×6 IMPLANT
GLOVE SURG LX 8.0 MICRO (GLOVE) ×4
GLOVE SURG LX STRL 8.0 MICRO (GLOVE) ×1 IMPLANT
GLOVE SURG TRIUMPH 8.0 PF LTX (GLOVE) ×3 IMPLANT
GOWN STRL REUS W/ TWL LRG LVL3 (GOWN DISPOSABLE) ×2 IMPLANT
GOWN STRL REUS W/TWL LRG LVL3 (GOWN DISPOSABLE) ×6
LENS IOL TECNIS ITEC 17.5 (Intraocular Lens) ×2 IMPLANT
MARKER SKIN DUAL TIP RULER LAB (MISCELLANEOUS) ×3 IMPLANT
NDL FILTER BLUNT 18X1 1/2 (NEEDLE) ×1 IMPLANT
NDL RETROBULBAR .5 NSTRL (NEEDLE) ×3 IMPLANT
NEEDLE FILTER BLUNT 18X 1/2SAF (NEEDLE) ×2
NEEDLE FILTER BLUNT 18X1 1/2 (NEEDLE) ×1 IMPLANT
PACK EYE AFTER SURG (MISCELLANEOUS) ×3 IMPLANT
PACK OPTHALMIC (MISCELLANEOUS) ×3 IMPLANT
PACK PORFILIO (MISCELLANEOUS) ×3 IMPLANT
SUT ETHILON 10-0 CS-B-6CS-B-6 (SUTURE)
SUTURE EHLN 10-0 CS-B-6CS-B-6 (SUTURE) IMPLANT
SYR 3ML LL SCALE MARK (SYRINGE) ×3 IMPLANT
SYR TB 1ML LUER SLIP (SYRINGE) ×3 IMPLANT
WATER STERILE IRR 250ML POUR (IV SOLUTION) ×3 IMPLANT
WIPE NON LINTING 3.25X3.25 (MISCELLANEOUS) ×3 IMPLANT

## 2019-07-23 NOTE — H&P (Signed)
All labs reviewed. Abnormal studies sent to patients PCP when indicated.  Previous H&P reviewed, patient examined, there are NO CHANGES.  Elijah Bertz Porfilio11/3/20207:24 AM

## 2019-07-23 NOTE — Anesthesia Procedure Notes (Signed)
Procedure Name: MAC Date/Time: 07/23/2019 7:34 AM Performed by: Georga Bora, CRNA Pre-anesthesia Checklist: Patient identified, Emergency Drugs available, Patient being monitored, Suction available and Timeout performed Patient Re-evaluated:Patient Re-evaluated prior to induction Oxygen Delivery Method: Nasal cannula

## 2019-07-23 NOTE — Anesthesia Postprocedure Evaluation (Signed)
Anesthesia Post Note  Patient: Elijah Nelson  Procedure(s) Performed: CATARACT EXTRACTION PHACO AND INTRAOCULAR LENS PLACEMENT (IOC) RIGHT (Right Eye)     Patient location during evaluation: PACU Anesthesia Type: MAC Level of consciousness: awake and alert Pain management: pain level controlled Vital Signs Assessment: post-procedure vital signs reviewed and stable Respiratory status: spontaneous breathing, nonlabored ventilation, respiratory function stable and patient connected to nasal cannula oxygen Cardiovascular status: stable and blood pressure returned to baseline Postop Assessment: no apparent nausea or vomiting Anesthetic complications: no    Alisa Graff

## 2019-07-23 NOTE — Anesthesia Preprocedure Evaluation (Addendum)
Anesthesia Evaluation  Patient identified by MRN, date of birth, ID band Patient awake    Reviewed: Allergy & Precautions, H&P , NPO status , Patient's Chart, lab work & pertinent test results, reviewed documented beta blocker date and time   Airway Mallampati: II  TM Distance: >3 FB Neck ROM: full    Dental no notable dental hx.    Pulmonary sleep apnea ,    Pulmonary exam normal breath sounds clear to auscultation       Cardiovascular Exercise Tolerance: Good + dysrhythmias Atrial Fibrillation  Rhythm:regular Rate:Normal     Neuro/Psych  Headaches, negative psych ROS   GI/Hepatic Neg liver ROS, GERD  ,  Endo/Other  negative endocrine ROS  Renal/GU negative Renal ROS  negative genitourinary   Musculoskeletal   Abdominal   Peds  Hematology negative hematology ROS (+)   Anesthesia Other Findings   Reproductive/Obstetrics negative OB ROS                             Anesthesia Physical Anesthesia Plan  ASA: III  Anesthesia Plan: MAC   Post-op Pain Management:    Induction:   PONV Risk Score and Plan: 1 and Treatment may vary due to age or medical condition  Airway Management Planned:   Additional Equipment:   Intra-op Plan:   Post-operative Plan:   Informed Consent: I have reviewed the patients History and Physical, chart, labs and discussed the procedure including the risks, benefits and alternatives for the proposed anesthesia with the patient or authorized representative who has indicated his/her understanding and acceptance.     Dental Advisory Given  Plan Discussed with: CRNA  Anesthesia Plan Comments:        Anesthesia Quick Evaluation

## 2019-07-23 NOTE — Op Note (Signed)
PREOPERATIVE DIAGNOSIS:  Nuclear sclerotic cataract of the right eye.   POSTOPERATIVE DIAGNOSIS:  Cataract   OPERATIVE PROCEDURE: Procedure(s): CATARACT EXTRACTION PHACO AND INTRAOCULAR LENS PLACEMENT (IOC) RIGHT   SURGEON:  Birder Robson, MD.   ANESTHESIA:  Anesthesiologist: Alisa Graff, MD CRNA: Georga Bora, CRNA  1.      Managed anesthesia care. 2.      0.35ml of Shugarcaine was instilled in the eye following the paracentesis.   COMPLICATIONS:  None.   TECHNIQUE:   Stop and chop   DESCRIPTION OF PROCEDURE:  The patient was examined and consented in the preoperative holding area where the aforementioned topical anesthesia was applied to the right eye and then brought back to the Operating Room where the right eye was prepped and draped in the usual sterile ophthalmic fashion and a lid speculum was placed. A paracentesis was created with the side port blade and the anterior chamber was filled with viscoelastic. A near clear corneal incision was performed with the steel keratome. A continuous curvilinear capsulorrhexis was performed with a cystotome followed by the capsulorrhexis forceps. Hydrodissection and hydrodelineation were carried out with BSS on a blunt cannula. The lens was removed in a stop and chop  technique and the remaining cortical material was removed with the irrigation-aspiration handpiece. The capsular bag was inflated with viscoelastic and the Technis ZCB00  lens was placed in the capsular bag without complication. The remaining viscoelastic was removed from the eye with the irrigation-aspiration handpiece. The wounds were hydrated. The anterior chamber was flushed with BSS and the eye was inflated to physiologic pressure. 0.25ml of Vigamox was placed in the anterior chamber. The wounds were found to be water tight. The eye was dressed with Combigan. The patient was given protective glasses to wear throughout the day and a shield with which to sleep tonight. The  patient was also given drops with which to begin a drop regimen today and will follow-up with me in one day. Implant Name Type Inv. Item Serial No. Manufacturer Lot No. LRB No. Used Action  LENS IOL DIOP 17.5 - [id number] Intraocular Lens LENS IOL DIOP 17.5 [id number]  Right 1 Implanted   Procedure(s) with comments: CATARACT EXTRACTION PHACO AND INTRAOCULAR LENS PLACEMENT (IOC) RIGHT (Right) - would like to go as early as possible  Electronically signed: Birder Robson 07/23/2019 7:54 AM

## 2019-07-23 NOTE — Transfer of Care (Signed)
Immediate Anesthesia Transfer of Care Note  Patient: Elijah Nelson  Procedure(s) Performed: CATARACT EXTRACTION PHACO AND INTRAOCULAR LENS PLACEMENT (IOC) RIGHT (Right Eye)  Patient Location: PACU  Anesthesia Type: MAC  Level of Consciousness: awake, alert  and patient cooperative  Airway and Oxygen Therapy: Patient Spontanous Breathing and Patient connected to supplemental oxygen  Post-op Assessment: Post-op Vital signs reviewed, Patient's Cardiovascular Status Stable, Respiratory Function Stable, Patent Airway and No signs of Nausea or vomiting  Post-op Vital Signs: Reviewed and stable  Complications: No apparent anesthesia complications

## 2019-07-24 ENCOUNTER — Encounter: Payer: Self-pay | Admitting: Ophthalmology

## 2019-08-06 ENCOUNTER — Other Ambulatory Visit: Payer: Self-pay

## 2019-08-06 ENCOUNTER — Encounter: Payer: Self-pay | Admitting: *Deleted

## 2019-08-09 ENCOUNTER — Other Ambulatory Visit: Payer: Self-pay

## 2019-08-09 ENCOUNTER — Other Ambulatory Visit
Admission: RE | Admit: 2019-08-09 | Discharge: 2019-08-09 | Disposition: A | Discharge status: Home / Self Care | Payer: Medicare Other | Source: Ambulatory Visit | Attending: Ophthalmology | Admitting: Ophthalmology

## 2019-08-09 DIAGNOSIS — Z01812 Encounter for preprocedural laboratory examination: Secondary | ICD-10-CM | POA: Insufficient documentation

## 2019-08-09 DIAGNOSIS — Z20828 Contact with and (suspected) exposure to other viral communicable diseases: Secondary | ICD-10-CM | POA: Insufficient documentation

## 2019-08-09 LAB — SARS CORONAVIRUS 2 (TAT 6-24 HRS): SARS Coronavirus 2: NEGATIVE

## 2019-08-09 NOTE — Discharge Instructions (Signed)

## 2019-08-13 ENCOUNTER — Ambulatory Visit: Payer: Medicare Other | Admitting: Anesthesiology

## 2019-08-13 ENCOUNTER — Ambulatory Visit
Admission: RE | Admit: 2019-08-13 | Discharge: 2019-08-13 | Disposition: A | Discharge status: Home / Self Care | Payer: Medicare Other | Attending: Ophthalmology | Admitting: Ophthalmology

## 2019-08-13 ENCOUNTER — Other Ambulatory Visit: Payer: Self-pay

## 2019-08-13 ENCOUNTER — Encounter
Admission: RE | Disposition: A | Discharge status: Home / Self Care | Payer: Self-pay | Source: Home / Self Care | Attending: Ophthalmology

## 2019-08-13 DIAGNOSIS — I499 Cardiac arrhythmia, unspecified: Secondary | ICD-10-CM | POA: Insufficient documentation

## 2019-08-13 DIAGNOSIS — N400 Enlarged prostate without lower urinary tract symptoms: Secondary | ICD-10-CM | POA: Diagnosis not present

## 2019-08-13 DIAGNOSIS — G4730 Sleep apnea, unspecified: Secondary | ICD-10-CM | POA: Diagnosis not present

## 2019-08-13 DIAGNOSIS — K219 Gastro-esophageal reflux disease without esophagitis: Secondary | ICD-10-CM | POA: Insufficient documentation

## 2019-08-13 DIAGNOSIS — E7800 Pure hypercholesterolemia, unspecified: Secondary | ICD-10-CM | POA: Diagnosis not present

## 2019-08-13 DIAGNOSIS — H2512 Age-related nuclear cataract, left eye: Secondary | ICD-10-CM | POA: Insufficient documentation

## 2019-08-13 HISTORY — PX: CATARACT EXTRACTION W/PHACO: SHX586

## 2019-08-13 SURGERY — PHACOEMULSIFICATION, CATARACT, WITH IOL INSERTION
Anesthesia: Monitor Anesthesia Care | Site: Eye | Laterality: Left

## 2019-08-13 MED ORDER — LIDOCAINE HCL (PF) 2 % IJ SOLN
INTRAOCULAR | Status: DC | PRN
  Administered 2019-08-13: 1 mL

## 2019-08-13 MED ORDER — MOXIFLOXACIN HCL 0.5 % OP SOLN
OPHTHALMIC | Status: DC | PRN
  Administered 2019-08-13: 0.2 mL via OPHTHALMIC

## 2019-08-13 MED ORDER — ARMC OPHTHALMIC DILATING DROPS
1.0000 "application " | OPHTHALMIC | Status: DC | PRN
  Administered 2019-08-13 (×3): 1 via OPHTHALMIC

## 2019-08-13 MED ORDER — EPINEPHRINE PF 1 MG/ML IJ SOLN
INTRAOCULAR | Status: DC | PRN
  Administered 2019-08-13: 10:00:00 64 mL via OPHTHALMIC

## 2019-08-13 MED ORDER — TETRACAINE HCL 0.5 % OP SOLN
1.0000 [drp] | OPHTHALMIC | Status: DC | PRN
  Administered 2019-08-13 (×3): 1 [drp] via OPHTHALMIC

## 2019-08-13 MED ORDER — MIDAZOLAM HCL 2 MG/2ML IJ SOLN
INTRAMUSCULAR | Status: DC | PRN
  Administered 2019-08-13: 2 mg via INTRAVENOUS

## 2019-08-13 MED ORDER — BRIMONIDINE TARTRATE-TIMOLOL 0.2-0.5 % OP SOLN
OPHTHALMIC | Status: DC | PRN
  Administered 2019-08-13: 1 [drp] via OPHTHALMIC

## 2019-08-13 MED ORDER — FENTANYL CITRATE (PF) 100 MCG/2ML IJ SOLN
INTRAMUSCULAR | Status: DC | PRN
  Administered 2019-08-13: 100 ug via INTRAVENOUS

## 2019-08-13 MED ORDER — NA CHONDROIT SULF-NA HYALURON 40-17 MG/ML IO SOLN
INTRAOCULAR | Status: DC | PRN
  Administered 2019-08-13: 1 mL via INTRAOCULAR

## 2019-08-13 SURGICAL SUPPLY — 20 items
CANNULA ANT/CHMB 27G (MISCELLANEOUS) ×2 IMPLANT
CANNULA ANT/CHMB 27GA (MISCELLANEOUS) ×4 IMPLANT
GLOVE SURG LX 8.0 MICRO (GLOVE) ×1
GLOVE SURG LX STRL 8.0 MICRO (GLOVE) ×1 IMPLANT
GLOVE SURG TRIUMPH 8.0 PF LTX (GLOVE) ×2 IMPLANT
GOWN STRL REUS W/ TWL LRG LVL3 (GOWN DISPOSABLE) ×2 IMPLANT
GOWN STRL REUS W/TWL LRG LVL3 (GOWN DISPOSABLE) ×4
LENS IOL TECNIS ITEC 17.5 (Intraocular Lens) ×1 IMPLANT
MARKER SKIN DUAL TIP RULER LAB (MISCELLANEOUS) ×2 IMPLANT
NDL FILTER BLUNT 18X1 1/2 (NEEDLE) ×1 IMPLANT
NDL RETROBULBAR .5 NSTRL (NEEDLE) ×2 IMPLANT
NEEDLE FILTER BLUNT 18X 1/2SAF (NEEDLE) ×1
NEEDLE FILTER BLUNT 18X1 1/2 (NEEDLE) ×1 IMPLANT
PACK EYE AFTER SURG (MISCELLANEOUS) ×2 IMPLANT
PACK OPTHALMIC (MISCELLANEOUS) ×2 IMPLANT
PACK PORFILIO (MISCELLANEOUS) ×2 IMPLANT
SYR 3ML LL SCALE MARK (SYRINGE) ×2 IMPLANT
SYR TB 1ML LUER SLIP (SYRINGE) ×2 IMPLANT
WATER STERILE IRR 250ML POUR (IV SOLUTION) ×2 IMPLANT
WIPE NON LINTING 3.25X3.25 (MISCELLANEOUS) ×2 IMPLANT

## 2019-08-13 NOTE — Anesthesia Procedure Notes (Signed)
Procedure Name: MAC Performed by: Hansford Hirt, CRNA Pre-anesthesia Checklist: Patient identified, Emergency Drugs available, Suction available, Timeout performed and Patient being monitored Patient Re-evaluated:Patient Re-evaluated prior to induction Oxygen Delivery Method: Nasal cannula Placement Confirmation: positive ETCO2       

## 2019-08-13 NOTE — H&P (Signed)
All labs reviewed. Abnormal studies sent to patients PCP when indicated.  Previous H&P reviewed, patient examined, there are NO CHANGES.  [name]11/24/20209:59 AM

## 2019-08-13 NOTE — Op Note (Signed)
PREOPERATIVE DIAGNOSIS:  Nuclear sclerotic cataract of the left eye.   POSTOPERATIVE DIAGNOSIS:  Nuclear sclerotic cataract of the left eye.   OPERATIVE PROCEDURE:@   SURGEON:  Birder Robson, MD.   ANESTHESIA:  Anesthesiologist: Sinda Du, MD CRNA: Mayme Genta, CRNA  1.      Managed anesthesia care. 2.     0.65ml of Shugarcaine was instilled following the paracentesis   COMPLICATIONS:  None.   TECHNIQUE:   Stop and chop   DESCRIPTION OF PROCEDURE:  The patient was examined and consented in the preoperative holding area where the aforementioned topical anesthesia was applied to the left eye and then brought back to the Operating Room where the left eye was prepped and draped in the usual sterile ophthalmic fashion and a lid speculum was placed. A paracentesis was created with the side port blade and the anterior chamber was filled with viscoelastic. A near clear corneal incision was performed with the steel keratome. A continuous curvilinear capsulorrhexis was performed with a cystotome followed by the capsulorrhexis forceps. Hydrodissection and hydrodelineation were carried out with BSS on a blunt cannula. The lens was removed in a stop and chop  technique and the remaining cortical material was removed with the irrigation-aspiration handpiece. The capsular bag was inflated with viscoelastic and the Technis ZCB00 lens was placed in the capsular bag without complication. The remaining viscoelastic was removed from the eye with the irrigation-aspiration handpiece. The wounds were hydrated. The anterior chamber was flushed with BSS and the eye was inflated to physiologic pressure. 0.57ml Vigamox was placed in the anterior chamber. The wounds were found to be water tight. The eye was dressed with Combigan. The patient was given protective glasses to wear throughout the day and a shield with which to sleep tonight. The patient was also given drops with which to begin a drop regimen today and  will follow-up with me in one day. Implant Name Type Inv. Item Serial No. Manufacturer Lot No. LRB No. Used Action  LENS IOL DIOP 17.5 - [id number] Intraocular Lens LENS IOL DIOP 17.5 [id number]  Left 1 Implanted    Procedure(s) with comments: CATARACT EXTRACTION PHACO AND INTRAOCULAR LENS PLACEMENT (IOC) LEFT 6.26  00:42.6 (Left) - sleep apnea  Electronically signed: Birder Robson 08/13/2019 10:27 AM

## 2019-08-13 NOTE — Transfer of Care (Signed)
Immediate Anesthesia Transfer of Care Note  Patient: Elijah Nelson  Procedure(s) Performed: CATARACT EXTRACTION PHACO AND INTRAOCULAR LENS PLACEMENT (IOC) LEFT 6.26  00:42.6 (Left Eye)  Patient Location: PACU  Anesthesia Type: MAC  Level of Consciousness: awake, alert  and patient cooperative  Airway and Oxygen Therapy: Patient Spontanous Breathing and Patient connected to supplemental oxygen  Post-op Assessment: Post-op Vital signs reviewed, Patient's Cardiovascular Status Stable, Respiratory Function Stable, Patent Airway and No signs of Nausea or vomiting  Post-op Vital Signs: Reviewed and stable  Complications: No apparent anesthesia complications

## 2019-08-13 NOTE — Anesthesia Preprocedure Evaluation (Signed)
Anesthesia Evaluation  Patient identified by MRN, date of birth, ID band Patient awake    Reviewed: Allergy & Precautions, H&P , NPO status , Patient's Chart, lab work & pertinent test results, reviewed documented beta blocker date and time   History of Anesthesia Complications Negative for: history of anesthetic complications  Airway Mallampati: II  TM Distance: >3 FB Neck ROM: full    Dental no notable dental hx.    Pulmonary sleep apnea ,    Pulmonary exam normal breath sounds clear to auscultation       Cardiovascular Exercise Tolerance: Good Normal cardiovascular exam+ dysrhythmias Atrial Fibrillation  Rhythm:regular Rate:Normal     Neuro/Psych  Headaches, negative psych ROS   GI/Hepatic Neg liver ROS, GERD  ,  Endo/Other  negative endocrine ROS  Renal/GU negative Renal ROS  negative genitourinary   Musculoskeletal  (+) Arthritis ,   Abdominal Normal abdominal exam  (+) - obese,  Abdomen: soft.    Peds  Hematology negative hematology ROS (+)   Anesthesia Other Findings   Reproductive/Obstetrics negative OB ROS                             Anesthesia Physical  Anesthesia Plan  ASA: III  Anesthesia Plan: MAC   Post-op Pain Management:    Induction:   PONV Risk Score and Plan: 1 and Treatment may vary due to age or medical condition  Airway Management Planned: Nasal Cannula  Additional Equipment:   Intra-op Plan:   Post-operative Plan:   Informed Consent: I have reviewed the patients History and Physical, chart, labs and discussed the procedure including the risks, benefits and alternatives for the proposed anesthesia with the patient or authorized representative who has indicated his/her understanding and acceptance.     Dental Advisory Given  Plan Discussed with: CRNA  Anesthesia Plan Comments:         Anesthesia Quick Evaluation  Patient Active  Problem List   Diagnosis Date Noted  . Benign prostatic hyperplasia with lower urinary tract symptoms 06/18/2018  . History of elevated PSA 06/18/2018  . Atrial fibrillation with RVR ([place]) 09/18/2017  . Chest pain 09/18/2017  . GERD (gastroesophageal reflux disease) 09/18/2017  . OSA (obstructive sleep apnea) 09/18/2017    CBC Latest Ref Rng & Units 09/19/2017 09/18/2017 02/19/2012  WBC 3.8 - 10.6 K/uL 6.8 8.0 7.8  Hemoglobin 13.0 - 18.0 g/dL 14.6 14.6 16.3  Hematocrit 40.0 - 52.0 % 42.8 42.8 47.6  Platelets 150 - 440 K/uL 147(L) 162 175   BMP Latest Ref Rng & Units 09/19/2017 09/18/2017 02/19/2012  Glucose 65 - 99 mg/dL 108(H) 103(H) 104(H)  BUN 6 - 20 mg/dL _0 Creatinine 0.61 - 1.24 mg/dL 0.95 0.87 1.04  Sodium 135 - 145 mmol/L 138 140 142  Potassium 3.5 - 5.1 mmol/L 3.8 3.6 3.4(L)  Chloride 101 - 111 mmol/L 106 104 103  CO2 22 - 32 mmol/L _1 Calcium 8.9 - 10.3 mg/dL 8.8(L) 9.0 9.4   Risks and benefits of anesthesia discussed at length, patient or surrogate demonstrates understanding. Appropriately NPO. Plan to proceed with anesthesia.  Champ Mungo, MD 08/13/19

## 2019-08-13 NOTE — Anesthesia Postprocedure Evaluation (Signed)
Anesthesia Post Note  Patient: Elijah Nelson  Procedure(s) Performed: CATARACT EXTRACTION PHACO AND INTRAOCULAR LENS PLACEMENT (IOC) LEFT 6.26  00:42.6 (Left Eye)     Anesthesia Post Evaluation  Sinda Du

## 2019-08-14 ENCOUNTER — Encounter: Payer: Self-pay | Admitting: Ophthalmology

## 2019-10-05 ENCOUNTER — Ambulatory Visit: Payer: Medicare Other | Attending: Internal Medicine

## 2019-10-05 DIAGNOSIS — Z23 Encounter for immunization: Secondary | ICD-10-CM | POA: Insufficient documentation

## 2019-10-05 NOTE — Progress Notes (Signed)
   Covid-19 Vaccination Clinic  Name:  AZREAL RIDGELL    MRN: [id number] DOB: [birth year]  10/05/2019  Mr. Boring was observed post Covid-19 immunization for 15 minutes without incidence. He was provided with Vaccine Information Sheet and instruction to access the V-Safe system.   Mr. Tracht was instructed to call 911 with any severe reactions post vaccine: Marland Kitchen Difficulty breathing  . Swelling of your face and throat  . A fast heartbeat  . A bad rash all over your body  . Dizziness and weakness    Immunizations Administered    Name Date Dose VIS Date Route   Pfizer COVID-19 Vaccine 10/05/2019 12:09 PM 0.3 mL 08/30/2019 Intramuscular   Manufacturer: [place]   Lot: [id number]   [place]: [id number]

## 2019-10-26 ENCOUNTER — Ambulatory Visit: Payer: Medicare Other | Attending: Internal Medicine

## 2019-10-26 DIAGNOSIS — Z23 Encounter for immunization: Secondary | ICD-10-CM | POA: Insufficient documentation

## 2019-10-26 NOTE — Progress Notes (Signed)
   Covid-19 Vaccination Clinic  Name:  Elijah Nelson    MRN: [id number] DOB: 1941/01/22  10/26/2019  Elijah Nelson was observed post Covid-19 immunization for 15 minutes without incidence. He was provided with Vaccine Information Sheet and instruction to access the V-Safe system.   Elijah Nelson was instructed to call 911 with any severe reactions post vaccine: Marland Kitchen Difficulty breathing  . Swelling of your face and throat  . A fast heartbeat  . A bad rash all over your body  . Dizziness and weakness    Immunizations Administered    Name Date Dose VIS Date Route   Pfizer COVID-19 Vaccine 10/26/2019 12:28 PM 0.3 mL 08/30/2019 Intramuscular   Manufacturer: [place]   Lot: [id number]   [place]: [id number]

## 2020-02-08 ENCOUNTER — Emergency Department: Payer: Medicare Other

## 2020-02-08 ENCOUNTER — Encounter: Payer: Self-pay | Admitting: Emergency Medicine

## 2020-02-08 ENCOUNTER — Emergency Department
Admission: EM | Admit: 2020-02-08 | Discharge: 2020-02-08 | Disposition: A | Discharge status: Other Acute Inpatient Hospital | Payer: Medicare Other | Attending: Emergency Medicine | Admitting: Emergency Medicine

## 2020-02-08 ENCOUNTER — Other Ambulatory Visit: Payer: Self-pay

## 2020-02-08 DIAGNOSIS — W3182XA Contact with other commercial machinery, initial encounter: Secondary | ICD-10-CM | POA: Diagnosis not present

## 2020-02-08 DIAGNOSIS — S52591B Other fractures of lower end of right radius, initial encounter for open fracture type I or II: Secondary | ICD-10-CM | POA: Diagnosis not present

## 2020-02-08 DIAGNOSIS — Y9389 Activity, other specified: Secondary | ICD-10-CM | POA: Diagnosis not present

## 2020-02-08 DIAGNOSIS — S41111A Laceration without foreign body of right upper arm, initial encounter: Secondary | ICD-10-CM

## 2020-02-08 DIAGNOSIS — Y999 Unspecified external cause status: Secondary | ICD-10-CM | POA: Insufficient documentation

## 2020-02-08 DIAGNOSIS — Y929 Unspecified place or not applicable: Secondary | ICD-10-CM | POA: Diagnosis not present

## 2020-02-08 DIAGNOSIS — I4891 Unspecified atrial fibrillation: Secondary | ICD-10-CM | POA: Diagnosis not present

## 2020-02-08 DIAGNOSIS — S61411A Laceration without foreign body of right hand, initial encounter: Secondary | ICD-10-CM | POA: Insufficient documentation

## 2020-02-08 DIAGNOSIS — S51811A Laceration without foreign body of right forearm, initial encounter: Secondary | ICD-10-CM | POA: Diagnosis present

## 2020-02-08 LAB — CBC WITH DIFFERENTIAL/PLATELET
Abs Immature Granulocytes: 0.02 10*3/uL (ref 0.00–0.07)
Basophils Absolute: 0.1 10*3/uL (ref 0.0–0.1)
Basophils Relative: 1 %
Eosinophils Absolute: 0.1 10*3/uL (ref 0.0–0.5)
Eosinophils Relative: 1 %
HCT: 39.5 % (ref 39.0–52.0)
Hemoglobin: 13.6 g/dL (ref 13.0–17.0)
Immature Granulocytes: 0 %
Lymphocytes Relative: 12 %
Lymphs Abs: 1.4 10*3/uL (ref 0.7–4.0)
MCH: 32.4 pg (ref 26.0–34.0)
MCHC: 34.4 g/dL (ref 30.0–36.0)
MCV: 94 fL (ref 80.0–100.0)
Monocytes Absolute: 0.8 10*3/uL (ref 0.1–1.0)
Monocytes Relative: 7 %
Neutro Abs: 8.9 10*3/uL — ABNORMAL HIGH (ref 1.7–7.7)
Neutrophils Relative %: 79 %
Platelets: 148 10*3/uL — ABNORMAL LOW (ref 150–400)
RBC: 4.2 MIL/uL — ABNORMAL LOW (ref 4.22–5.81)
RDW: 13.4 % (ref 11.5–15.5)
WBC: 11.3 10*3/uL — ABNORMAL HIGH (ref 4.0–10.5)
nRBC: 0 % (ref 0.0–0.2)

## 2020-02-08 LAB — COMPREHENSIVE METABOLIC PANEL
ALT: 31 U/L (ref 0–44)
AST: 34 U/L (ref 15–41)
Albumin: 4.6 g/dL (ref 3.5–5.0)
Alkaline Phosphatase: 122 U/L (ref 38–126)
Anion gap: 8 (ref 5–15)
BUN: 22 mg/dL (ref 8–23)
CO2: 25 mmol/L (ref 22–32)
Calcium: 9.3 mg/dL (ref 8.9–10.3)
Chloride: 107 mmol/L (ref 98–111)
Creatinine, Ser: 0.89 mg/dL (ref 0.61–1.24)
GFR calc Af Amer: >60 mL/min (ref >60)
GFR calc non Af Amer: >60 mL/min (ref >60)
Glucose, Bld: 86 mg/dL (ref 70–99)
Potassium: 3.7 mmol/L (ref 3.5–5.1)
Sodium: 140 mmol/L (ref 135–145)
Total Bilirubin: 1.6 mg/dL — ABNORMAL HIGH (ref 0.3–1.2)
Total Protein: 7.4 g/dL (ref 6.5–8.1)

## 2020-02-08 LAB — TYPE AND SCREEN
ABO/RH(D): O POS
Antibody Screen: NEGATIVE

## 2020-02-08 MED ORDER — HYDROMORPHONE HCL 1 MG/ML IJ SOLN: 1.0000 mg | Freq: Once | INTRAMUSCULAR | Status: AC

## 2020-02-08 MED ORDER — ONDANSETRON 8 MG PO TBDP
8.0000 mg | ORAL_TABLET | Freq: Once | ORAL | Status: AC
  Administered 2020-02-08: 8 mg via ORAL
  Filled 2020-02-08: qty 1

## 2020-02-08 MED ORDER — HYDROMORPHONE HCL 1 MG/ML IJ SOLN
INTRAMUSCULAR | Status: AC
  Administered 2020-02-08: 1 mg via INTRAVENOUS
  Filled 2020-02-08: qty 1

## 2020-02-08 MED ORDER — CEFAZOLIN SODIUM-DEXTROSE 2-4 GM/100ML-% IV SOLN
2.0000 g | Freq: Once | INTRAVENOUS | Status: AC
  Administered 2020-02-08: 2 g via INTRAVENOUS
  Filled 2020-02-08: qty 100

## 2020-02-08 MED ORDER — FENTANYL CITRATE (PF) 100 MCG/2ML IJ SOLN
100.0000 ug | Freq: Once | INTRAMUSCULAR | Status: AC
  Administered 2020-02-08: 100 ug via INTRAMUSCULAR
  Filled 2020-02-08: qty 2

## 2020-02-08 MED ORDER — SODIUM CHLORIDE 0.9 % IV BOLUS
1000.0000 mL | Freq: Once | INTRAVENOUS | Status: AC
  Administered 2020-02-08: 1000 mL via INTRAVENOUS

## 2020-02-08 NOTE — ED Provider Notes (Signed)
[facility] Emergency Department Provider Note  ____________________________________________  Time seen: Approximately 3:02 PM  I have reviewed the triage vital signs and the nursing notes.   HISTORY  Chief Complaint Arm Pain    HPI Elijah Nelson is a 79 y.o. male who presents the emergency department complaining of right arm injury.  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  Patient states that the fan belt had come loose, he was using wrenches to adjust the motor to tighten the belt.  Patient states that the fan was still running, when the wrenches became caught in the fan blades causing a twisting motion.  Patient is unsure but does not believe that his arm actually made contact with the fan blades or the belt.  Patient states that he had definitely a rotational type injury to the forearm, had immediate pain, ecchymosis.  Patient has multiple lacerations to the forearm, hand.  No other injury or complaint.  Last tetanus shot was 3 to 4 years ago.  No medications prior to arrival.         Past Medical History:  Diagnosis Date  . Arthritis    neck  . Atrial fibrillation ([place])   . BPH (benign prostatic hyperplasia)   . Chronic headache   . GERD (gastroesophageal reflux disease)   . History of skin cancer   . History of skin cancer   . OSA (obstructive sleep apnea)    CPAP    Patient Active Problem List   Diagnosis Date Noted  . Benign prostatic hyperplasia with lower urinary tract symptoms 06/18/2018  . History of elevated PSA 06/18/2018  . Atrial fibrillation with RVR ([place]) 09/18/2017  . Chest pain 09/18/2017  . GERD (gastroesophageal reflux disease) 09/18/2017  . OSA (obstructive sleep apnea) 09/18/2017    Past Surgical History:  Procedure Laterality Date  . CATARACT EXTRACTION W/PHACO Right 07/23/2019   Procedure: CATARACT EXTRACTION PHACO AND INTRAOCULAR LENS PLACEMENT ([place]) RIGHT;   Surgeon: Birder Robson, MD;  Location: [facility];  Service: Ophthalmology;  Laterality: Right;  1:10 16.7% 11.84  . CATARACT EXTRACTION W/PHACO Left 08/13/2019   Procedure: CATARACT EXTRACTION PHACO AND INTRAOCULAR LENS PLACEMENT ([facility]) LEFT 6.26  00:42.6;  Surgeon: Birder Robson, MD;  Location: [facility];  Service: Ophthalmology;  Laterality: Left;  sleep apnea  . HERNIA REPAIR      Prior to Admission medications   Medication Sig Start Date End Date Taking? Authorizing Provider  aspirin 81 MG EC tablet Take 81 mg by mouth daily. 06/11/12  Yes [provider]  atorvastatin (LIPITOR) 40 MG tablet  05/16/18  Yes [provider]  glucosamine-chondroitin 500-400 MG tablet Take 2 tablets by mouth daily.   Yes [provider]  Multiple Vitamin (MULTI-VITAMINS) TABS Take by mouth.   Yes [provider]  omega-3 acid ethyl esters (LOVAZA) 1 g capsule Take 1 g by mouth daily.   Yes [provider]  omeprazole (PRILOSEC) 20 MG capsule Take 20 mg by mouth daily. 06/11/12  Yes [provider]  tamsulosin (FLOMAX) 0.4 MG CAPS capsule Take 1 capsule (0.4 mg total) by mouth every other day. 11/02/18  Yes Stoioff, Ronda Fairly, MD  Turmeric 500 MG CAPS Take by mouth.   Yes [provider]  vitamin B-12 (CYANOCOBALAMIN) 500 MCG tablet Take 500 mcg by mouth daily.   Yes [provider]    Allergies Cyclobenzaprine, Morphine, Oxycodone-acetaminophen,  Tadalafil, and Tamsulosin  Family History  Problem Relation Age of Onset  . Breast cancer Mother   . Heart attack Father   . Heart disease Father     Social History Social History   Tobacco Use  . Smoking status: Never Smoker  . Smokeless tobacco: Never Used  Substance Use Topics  . Alcohol use: Yes    Alcohol/week: 7.0 standard drinks    Types: 7 Glasses of wine per week  . Drug use: No     Review of Systems  Constitutional: No fever/chills Eyes: No  visual changes. No discharge ENT: No upper respiratory complaints. Cardiovascular: no chest pain. Respiratory: no cough. No SOB. Gastrointestinal: No abdominal pain.  No nausea, no vomiting.  Musculoskeletal: Injury to the right forearm Skin: Negative for rash, abrasions, lacerations, ecchymosis. Neurological: Negative for headaches, focal weakness or numbness. 10-point ROS otherwise negative.  ____________________________________________   PHYSICAL EXAM:  VITAL SIGNS: ED Triage Vitals  Enc Vitals Group     BP 02/08/20 1423 123/69     Pulse Rate 02/08/20 1423 63     Resp 02/08/20 1423 16     Temp 02/08/20 1423 (!) 97.5 F (36.4 C)     Temp Source 02/08/20 1423 Oral     SpO2 02/08/20 1423 98 %     Weight 02/08/20 1424 205 lb (93 kg)     Height 02/08/20 1424 6\' 3"  (1.905 m)     Head Circumference --      Peak Flow --      Pain Score 02/08/20 1423 9     Pain Loc --      Pain Edu? --      Excl. in [place]? --      Constitutional: Alert and oriented. Well appearing and in no acute distress. Eyes: Conjunctivae are normal. PERRL. EOMI. Head: Atraumatic. ENT:      Ears:       Nose: No congestion/rhinnorhea.      Mouth/Throat: Mucous membranes are moist.  Neck: No stridor.    Cardiovascular: Normal rate, regular rhythm. Normal S1 and S2.  Good peripheral circulation. Respiratory: Normal respiratory effort without tachypnea or retractions. Lungs CTAB. Good air entry to the bases with no decreased or absent breath sounds. Musculoskeletal: Visualization of the right forearm reveals deformity along the distal third of the radius.  Obvious tenting of the skin.  There is a laceration/skin tear in the same area.  There does not appear to be any exposed bloody material through the subcutaneous tissue however.  Patient has multiple lacerations to the right hand.  Most of these appear superficial scabbing in place.  No visualized foreign body.  No range of motion of the right arm below the  level of the shoulder.  Patient with obvious palpable abnormality distal third of the forearm over the radius.  Radial pulse present.  Sensation intact but decreased when compared with left.  Sensation is globally slightly decreased.  No attempt at range of motion of the wrist, digits.  Capillary refill less than 2 seconds all digits. Neurologic:  Normal speech and language. No gross focal neurologic deficits are appreciated.  Skin:  Skin is warm, dry and intact. No rash noted. Psychiatric: Mood and affect are normal. Speech and behavior are normal. Patient exhibits appropriate insight and judgement.   ____________________________________________   LABS (all labs ordered are listed, but only abnormal results are displayed)  Labs Reviewed  COMPREHENSIVE METABOLIC PANEL - Abnormal; Notable for the following components:  Result Value   Total Bilirubin 1.6 (*)    All other components within normal limits  CBC WITH DIFFERENTIAL/PLATELET - Abnormal; Notable for the following components:   WBC 11.3 (*)    RBC 4.20 (*)    Platelets 148 (*)    Neutro Abs 8.9 (*)    All other components within normal limits  TYPE AND SCREEN   ____________________________________________  EKG   ____________________________________________  RADIOLOGY I personally viewed and evaluated these images as part of my medical decision making, as well as reviewing the written report by the radiologist.  DG Forearm Right  Result Date: 02/08/2020 CLINICAL DATA:  Right arm injury with deformity and skin tear. EXAM: RIGHT FOREARM - 2 VIEW COMPARISON:  None. FINDINGS: There is a transverse, mildly comminuted fracture through the distal radial diaphysis which demonstrates a full shaft width of volar displacement and mild overriding of the fracture fragments. There is probable rotational displacement as well. The ulna appears intact. No dislocation identified at the wrist or elbow. There is soft tissue irregularity  surrounding the fracture without foreign body. IMPRESSION: Displaced fracture of the distal radial diaphysis as described, potentially open per given history. The ulna appears intact, and no dislocation identified. Electronically Signed   By: Richardean Sale M.D.   On: 02/08/2020 14:57   DG Hand Complete Right  Result Date: 02/08/2020 CLINICAL DATA:  Post right arm injury. EXAM: RIGHT HAND - COMPLETE 3+ VIEW COMPARISON:  None. FINDINGS: There is no evidence of fracture or dislocation. There is no evidence of arthropathy or other focal bone abnormality. Soft tissues are unremarkable. IMPRESSION: No evidence of fracture of the right hand. Electronically Signed   By: Fidela Salisbury M.D.   On: 02/08/2020 16:48    ____________________________________________    PROCEDURES  Procedure(s) performed:    .Splint Application  Date/Time: 02/08/2020 4:20 PM Performed by: Darletta Moll, PA-C Authorized by: Darletta Moll, PA-C   Consent:    Consent obtained:  Verbal   Consent given by:  Patient   Risks discussed:  Pain and swelling Pre-procedure details:    Sensation:  Numbness Procedure details:    Laterality:  Right   Location:  Arm   Arm:  R lower arm   Splint type:  Sugar tong   Supplies:  Cotton padding, Ortho-Glass and elastic bandage Post-procedure details:    Pain:  Improved   Post-procedure CMS: Slightly decreased sensation compared to unaffected side.  Gross sensation still intact however.. Radial pulse still intact.  Capillary refill less than 2 seconds all digits.   Patient tolerance of procedure:  Tolerated well, no immediate complications  .Critical Care Performed by: Darletta Moll, PA-C Authorized by: Darletta Moll, PA-C   Critical care provider statement:    Critical care time (minutes):  45   Critical care was necessary to treat or prevent imminent or life-threatening deterioration of the following conditions:  Trauma   Critical care  was time spent personally by me on the following activities:  Obtaining history from patient or surrogate, examination of patient, discussions with consultants, development of treatment plan with patient or surrogate, evaluation of patient's response to treatment, ordering and performing treatments and interventions, ordering and review of laboratory studies, ordering and review of radiographic studies, pulse oximetry and re-evaluation of patient's condition      Medications  fentaNYL (SUBLIMAZE) injection 100 mcg (100 mcg Intramuscular Given 02/08/20 1521)  ondansetron (ZOFRAN-ODT) disintegrating tablet 8 mg (8 mg Oral Given 02/08/20 1521)  sodium chloride 0.9 % bolus 1,000 mL (0 mLs Intravenous Stopped 02/08/20 1732)  ceFAZolin (ANCEF) IVPB 2g/100 mL premix (0 g Intravenous Stopped 02/08/20 1727)  HYDROmorphone (DILAUDID) injection 1 mg (1 mg Intravenous Given 02/08/20 1754)     ____________________________________________   INITIAL IMPRESSION / ASSESSMENT AND PLAN / ED COURSE  Pertinent labs & imaging results that were available during my care of the patient were reviewed by me and considered in my medical decision making (see chart for details).  Review of the [place] CSRS was performed in accordance of the [place] prior to dispensing any controlled drugs.  Clinical Course as of Feb 07 1802  Sat Feb 08, 2020  1753 Patient presented to emergency department after sustaining an injury to the right forearm.  Patient states that he was using tools to tighten a belt controlling an industrial size fan.  Patient states that he believes that the tool slipped into the fan blades itself, this forcefully twisted his forearm.  Patient does not remember making contact with the blades but immediately had pain, swelling and deformity of the forearm.  No history of previous fractures to the right upper extremity.  Patient sustained multiple lacerations to the hand, wrist, forearm.  No medications prior to arrival.   His tetanus shot was up-to-date.  On exam patient had an obvious deformity with overlying soft tissue injury consistent with likely open fracture.  Imaging reveals displaced comminuted open fracture.  In addition patient sustained multiple soft tissue wounds to the anterior wrist, hand.  No evidence of further osseous trauma.  Given the nature of the fracture, multiple other wounds, I discussed the case with our on-call orthopedic surgeon, Dr. Posey Pronto.  After discussing the mechanism of injury, physical exam findings, imaging results, orthopedic surgeon advised that he felt patient would best be suited with a tertiary center orthopedic care.  Patient will be transferred to [facility] emergency department via EMS for Ortho trauma.  Dr. Christin Bach is the accepting physician at [facility].   [JC]  [id number] Patient had soft tissue injury of the anterior wrist.  Patient was placed in a sugar tong splint for the fracture, this was not extended as far as I typically would given the ongoing bleeding of the wrist so that I could further evaluate the area.  This required Surgicel dressing, slight pressure bandaging.  Patient did initially have some bleedthrough and with further gauze, this was eventually controlled with no further bleedthrough.  Again, splint was not extended as far as I typically would for this fracture just so I could further evaluate ongoing bleeding status.   [JC]    Clinical Course User Index [JC] Star Cheese, Charline Bills, PA-C          Patient's diagnosis is consistent with open fracture of the distal radius, multiple lacerations of the right upper extremity.  Patient presented to emergency department after sustaining trauma to the right arm.  Patient has an open fracture as well as multiple lacerations to the hand and arm.  Given the nature of injury, patient required transfer to a tertiary center for orthopedic trauma service.  [facility] accepts the patient in an ED to ED transfer with auto accept given the nature  of injury.  Patient remained stable in the emergency department.  Antibiotics prophylactically.  Patient's tetanus shot was up-to-date.  Labs are stable.  Patient was provided pain relief with narcotics here in the emergency department..  Patient will be transferred via [facility] to [facility] ED.  Patient is stable  for transfer at this time..     ____________________________________________  FINAL CLINICAL IMPRESSION(S) / ED DIAGNOSES  Final diagnoses:  Other type I or II open fracture of distal end of right radius, initial encounter  Laceration of multiple sites of right upper extremity, initial encounter      NEW MEDICATIONS STARTED DURING THIS VISIT:  ED Discharge Orders    None          This chart was dictated using voice recognition software/Dragon. Despite best efforts to proofread, errors can occur which can change the meaning. Any change was purely unintentional.    Darletta Moll, PA-C 02/08/20 1804    Nance Pear, MD 02/08/20 [phone]

## 2020-02-08 NOTE — ED Triage Notes (Signed)
Pt to ED via POV c/p right arm injury. Pt states that he was adjusting the tightness of the belt on a large fan, pt states that the wrench got caught in the fan blade and twisted his arm around. Pt has obvious deformity to his right arm. Pt has skin tear on his right forearm and small laceration on the right pink. Pt c/o severe pain. Pt is in NAD.

## 2020-02-08 NOTE — ED Notes (Signed)
See triage note  Presents with injury to right forearm   States he was trying to adjust to belt on fan   States he fan was moving

## 2020-02-09 DIAGNOSIS — S52309A Unspecified fracture of shaft of unspecified radius, initial encounter for closed fracture: Secondary | ICD-10-CM | POA: Insufficient documentation

## 2020-02-26 DIAGNOSIS — S52551A Other extraarticular fracture of lower end of right radius, initial encounter for closed fracture: Secondary | ICD-10-CM | POA: Insufficient documentation

## 2020-02-26 DIAGNOSIS — S61219A Laceration without foreign body of unspecified finger without damage to nail, initial encounter: Secondary | ICD-10-CM | POA: Insufficient documentation

## 2020-03-12 ENCOUNTER — Encounter: Payer: Self-pay | Admitting: Occupational Therapy

## 2020-03-12 ENCOUNTER — Ambulatory Visit: Payer: Medicare Other | Attending: Physician Assistant | Admitting: Occupational Therapy

## 2020-03-12 ENCOUNTER — Other Ambulatory Visit: Payer: Self-pay

## 2020-03-12 DIAGNOSIS — M79641 Pain in right hand: Secondary | ICD-10-CM | POA: Insufficient documentation

## 2020-03-12 DIAGNOSIS — M25631 Stiffness of right wrist, not elsewhere classified: Secondary | ICD-10-CM

## 2020-03-12 DIAGNOSIS — L905 Scar conditions and fibrosis of skin: Secondary | ICD-10-CM | POA: Insufficient documentation

## 2020-03-12 DIAGNOSIS — M25641 Stiffness of right hand, not elsewhere classified: Secondary | ICD-10-CM | POA: Insufficient documentation

## 2020-03-12 DIAGNOSIS — M6281 Muscle weakness (generalized): Secondary | ICD-10-CM | POA: Diagnosis present

## 2020-03-12 DIAGNOSIS — R600 Localized edema: Secondary | ICD-10-CM | POA: Diagnosis not present

## 2020-03-12 NOTE — Therapy (Signed)
[facility] [address], [place], [address] Phone: [phone]   Fax:  [phone]  Occupational Therapy Evaluation  Patient Details  Name: Elijah Nelson MRN: [id number] Date of Birth: 04-24-1941 Referring Provider (OT): Candace Gallus PA    Encounter Date: 03/12/2020   OT End of Session - 03/12/20 1258    Visit Number 1    Number of Visits 16    Date for OT Re-Evaluation 05/07/20    OT Start Time 1115    OT Stop Time 1216    OT Time Calculation (min) 61 min    Activity Tolerance Patient tolerated treatment well    Behavior During Therapy [facility] for tasks assessed/performed           Past Medical History:  Diagnosis Date   Arthritis    neck   Atrial fibrillation ([facility])    BPH (benign prostatic hyperplasia)    Chronic headache    GERD (gastroesophageal reflux disease)    History of skin cancer    History of skin cancer    OSA (obstructive sleep apnea)    CPAP    Past Surgical History:  Procedure Laterality Date   CATARACT EXTRACTION W/PHACO Right 07/23/2019   Procedure: CATARACT EXTRACTION PHACO AND INTRAOCULAR LENS PLACEMENT ([place]) RIGHT;  Surgeon: Birder Robson, MD;  Location: [facility];  Service: Ophthalmology;  Laterality: Right;  1:10 16.7% 11.84   CATARACT EXTRACTION W/PHACO Left 08/13/2019   Procedure: CATARACT EXTRACTION PHACO AND INTRAOCULAR LENS PLACEMENT ([facility]) LEFT 6.26  00:42.6;  Surgeon: Birder Robson, MD;  Location: [facility];  Service: Ophthalmology;  Laterality: Left;  sleep apnea   HERNIA REPAIR      There were no vitals filed for this visit.   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 Yes    Pain Score 3     Pain Location Wrist    Pain Orientation Right    Pain Descriptors / Indicators Aching;Sore;Tender    Pain Type Surgical pain;Acute pain    Pain Onset More than a month ago    Pain Frequency Intermittent             [facility] OT Assessment - 03/12/20 0001      Assessment   Medical Diagnosis R distal radius fx with ORIF and lacerations     Referring Provider (OT) Candace Gallus PA     Onset Date/Surgical Date 02/09/20    Hand Dominance Left    Next MD Visit --   04/28/20     Precautions   Precaution Comments --   5 lbs limited    Required Braces or Orthoses --   prefab wrist  splint      Prior Function   Vocation Retired    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       AROM   Right Forearm Pronation 90 Degrees    Right Forearm Supination 55 Degrees    Left Forearm Pronation 90 Degrees    Left Forearm Supination 90 Degrees    Right Wrist Extension 8 Degrees    Right Wrist Flexion 31 Degrees    Right Wrist Radial Deviation 11 Degrees    Right Wrist Ulnar Deviation 12 Degrees    Left Wrist Extension 52 Degrees    Left Wrist Flexion 80 Degrees    Left Wrist Radial Deviation 20 Degrees    Left Wrist Ulnar Deviation 32 Degrees      Left Hand AROM   L Thumb MCP 0-60 40 Degrees    L Thumb IP 0-80 25 Degrees    L Thumb Radial ADduction/ABduction 0-55 48    L Thumb Palmar ADduction/ABduction 0-45 52    L Thumb Opposition to Index --   Opposition to 3rd    L Index  MCP 0-90 65 Degrees    L Index PIP 0-100 75 Degrees      L Long  MCP 0-90 65 Degrees    L Long PIP 0-100 80 Degrees    L Ring  MCP 0-90 50 Degrees    L Ring PIP 0-100 85 Degrees    L Little  MCP 0-90 50 Degrees    L Little PIP 0-100 75 Degrees           contrast prior to review of HEP  And then AROM for wrist in all planes - close hand and open hand flexion , ext RD, UD ,sup and pronation - pain keep under 2/10   Tendon glides - with fist to 2 cm foam block  And opposition  Pick up 1 cm foam block  Thumb  PA and RA AROM   pain fre                 OT Education - 03/12/20 1257    Education Details findings of eval and HEP    Person(s) Educated Patient    Methods Explanation;Demonstration;Tactile cues;Verbal cues;Handout    Comprehension Verbal cues required;Returned demonstration;Verbalized understanding            OT Short Term Goals - 03/12/20 1504      OT SHORT TERM GOAL #1   Title Pt to be independent in HEP to decrease edema and increase AROM in digits flexion to touch palm and thumb to hold tool , plate and cup    Baseline decrease AROM in all digits flexion and thumb - see flowsheet-no knowledge of HEP    Time 3    Period Weeks    Status New    Target Date 04/02/20      OT SHORT TERM GOAL #2   Title R wrist AROM improve with more than 10-40 degrees to turn doorknob and use hand in more than 50% of ADL's    Baseline AROM or R wrist decrease in all motion- see flowsheet- cannot turn doorknob -and 30% use of R hand in bathingand dressing    Time 4    Period Weeks    Status New    Target Date 04/09/20             OT Long Term Goals - 03/12/20 1512  OT LONG TERM GOAL #1   Title Pt to increase R hand grip strenght to more than 50% compare to L hand to grip tools and squeeze washclotth    Baseline NT grip - decrease fisting - only 4 1/2 wsks s/p    Time 8    Period Weeks    Status New    Target Date 05/07/20      OT LONG TERM GOAL #2   Title R wrist AROM improve to [facility] to returning use R  hand 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    Baseline R wrist AROM decrease in all planes -and using hand only in 30%    Time 8    Period Weeks    Status New    Target Date 05/07/20      OT LONG TERM GOAL #3   Title Function score on PRWHE improve with more than 30 point    Baseline at eval PRWHE function score is 42.5/50    Time 8    Period Weeks    Status New    Target Date 05/07/20                 Plan - 03/12/20 1458    Clinical Impression Statement Pt is 4 1/2 wks s/p R distal radius fx ORIF and lacerations - pt come out of cast 2 days ago and stitches was removed-  scar from thenar eminence of thumb to mid forearm on volar radial side - pt show increase edema , scar tissue,  pain  with decrease ROM in r hand and wrist  - decrease strength limiting his functional use of R hand in ADL's and IADL's    OT Occupational Profile and History Problem Focused Assessment - Including review of records relating to presenting problem    Occupational performance deficits (Please refer to evaluation for details): ADL's;IADL's;Work;Play;Leisure    Body Structure / Function / Physical Skills ADL;ROM;UE functional use;Flexibility;FMC;Scar mobility;Dexterity;Edema;Pain;Strength;IADL    Rehab Potential Good    Clinical Decision Making Limited treatment options, no task modification necessary    Comorbidities Affecting Occupational Performance: None    Modification or Assistance to Complete Evaluation  No modification of tasks or assist necessary to complete eval    OT Frequency 2x / week    OT Duration 8 weeks    OT Treatment/Interventions Self-care/ADL training;Splinting;Patient/family education;Paraffin;Fluidtherapy;Contrast Bath;Manual Therapy;Passive range of motion;Scar mobilization;Therapeutic exercise    Plan assess progress with HEP a    Consulted and Agree with Plan of Care Patient           Patient will benefit from skilled therapeutic intervention in order to  improve the following deficits and impairments:   Body Structure / Function / Physical Skills: ADL, ROM, UE functional use, Flexibility, FMC, Scar mobility, Dexterity, Edema, Pain, Strength, IADL       Visit Diagnosis: Localized edema  Scar condition and fibrosis of skin  Stiffness of right hand, not elsewhere classified  Stiffness of right wrist, not elsewhere classified  Muscle weakness (generalized)  Pain in right hand    Problem List Patient Active Problem List   Diagnosis Date Noted   Benign prostatic hyperplasia with lower urinary tract symptoms 06/18/2018   History of elevated PSA 06/18/2018   Atrial fibrillation with RVR ([place]) 09/18/2017   Chest pain 09/18/2017   GERD (gastroesophageal reflux disease) 09/18/2017   OSA (obstructive sleep apnea) 09/18/2017    Rosalyn Gess OTR/L,CLT  03/12/2020, 3:16 PM  [facility] [address] [place], [address] Phone: [phone]   Fax:  [phone]  Name: Elijah Nelson MRN: [id number] Date of Birth: November 08, 1940

## 2020-03-12 NOTE — Patient Instructions (Signed)
See note

## 2020-03-17 ENCOUNTER — Ambulatory Visit: Payer: Medicare Other | Admitting: Occupational Therapy

## 2020-03-17 ENCOUNTER — Other Ambulatory Visit: Payer: Self-pay

## 2020-03-17 DIAGNOSIS — R600 Localized edema: Secondary | ICD-10-CM | POA: Diagnosis not present

## 2020-03-17 DIAGNOSIS — M6281 Muscle weakness (generalized): Secondary | ICD-10-CM

## 2020-03-17 DIAGNOSIS — M25631 Stiffness of right wrist, not elsewhere classified: Secondary | ICD-10-CM

## 2020-03-17 DIAGNOSIS — M25641 Stiffness of right hand, not elsewhere classified: Secondary | ICD-10-CM

## 2020-03-17 DIAGNOSIS — L905 Scar conditions and fibrosis of skin: Secondary | ICD-10-CM

## 2020-03-17 DIAGNOSIS — M79641 Pain in right hand: Secondary | ICD-10-CM

## 2020-03-17 NOTE — Therapy (Signed)
[facility] [address] [place], [address] Phone: [phone]   Fax:  [phone]  Occupational Therapy Treatment  Patient Details  Name: Elijah Nelson MRN: [id number] Date of Birth: 1941/02/13 Referring Provider (OT): Candace Gallus PA    Encounter Date: 03/17/2020   OT End of Session - 03/17/20 1711    Visit Number 2    Number of Visits 16    Date for OT Re-Evaluation 05/07/20    OT Start Time 1430    OT Stop Time 1524    OT Time Calculation (min) 54 min    Activity Tolerance Patient tolerated treatment well    Behavior During Therapy [facility] for tasks assessed/performed           Past Medical History:  Diagnosis Date  . Arthritis    neck  . Atrial fibrillation ([place])   . BPH (benign prostatic hyperplasia)   . Chronic headache   . GERD (gastroesophageal reflux disease)   . History of skin cancer   . History of skin cancer   . OSA (obstructive sleep apnea)    CPAP    Past Surgical History:  Procedure Laterality Date  . CATARACT EXTRACTION W/PHACO Right 07/23/2019   Procedure: CATARACT EXTRACTION PHACO AND INTRAOCULAR LENS PLACEMENT ([place]) RIGHT;  Surgeon: Birder Robson, MD;  Location: [place];  Service: Ophthalmology;  Laterality: Right;  1:10 16.7% 11.84  . CATARACT EXTRACTION W/PHACO Left 08/13/2019   Procedure: CATARACT EXTRACTION PHACO AND INTRAOCULAR LENS PLACEMENT ([facility]) LEFT 6.26  00:42.6;  Surgeon: Birder Robson, MD;  Location: [place];  Service: Ophthalmology;  Laterality: Left;  sleep apnea  . HERNIA REPAIR      There were no vitals filed for this visit.   Subjective Assessment - 03/17/20 1709    Subjective  Done okay -doing the exercises ramdomly - about 2 x day the whole thing- sleeping with glove only - sterri strips come off and scabs    Pertinent History 02/08/2020 had accident working on fan when wrench was caught in blades resulting in distal R  radius fx - ORIF and plastic surgery were done 5/23 - and was in long cast then short cast - and stitches taken out 03/10/20 - and in removal wrist splint now -refer to OT to start hand therapy    Patient Stated Goals want to be albe to use my R hand to use tools, squeeze , lift , carry and pull/push objects    Currently in Pain? Yes    Pain Score 2     Pain Location Wrist    Pain Orientation Right    Pain Descriptors / Indicators Aching;Tightness;Sore    Pain Type Acute pain;Surgical pain    Pain Onset More than a month ago    Pain Frequency Intermittent              [facility] OT Assessment - 03/17/20 0001      AROM   Right Forearm Pronation 90 Degrees    Right Forearm Supination 70 Degrees    Left Forearm Pronation 90 Degrees    Left Forearm Supination 90 Degrees    Right Wrist Extension 17 Degrees    Right Wrist Flexion 53 Degrees    Right Wrist Radial Deviation 15 Degrees    Right Wrist Ulnar Deviation 20 Degrees      Left Hand AROM   L Thumb MCP 0-60 40 Degrees    L Thumb IP 0-80  32 Degrees    L Thumb Radial ADduction/ABduction 0-55 55    L Thumb Palmar ADduction/ABduction 0-45 60    L Index  MCP 0-90 70 Degrees    L Index PIP 0-100 80 Degrees    L Long  MCP 0-90 70 Degrees    L Long PIP 0-100 82 Degrees    L Ring  MCP 0-90 65 Degrees    L Ring PIP 0-100 90 Degrees    L Little  MCP 0-90 70 Degrees    L Little PIP 0-100 90 Degrees            Great progress in AROM for digits and wrist in R hand and wrist  See flowsheet          OT Treatments/Exercises (OP) - 03/17/20 0001      RUE Fluidotherapy   Number Minutes Fluidotherapy 8 Minutes    RUE Fluidotherapy Location Hand;Wrist    Comments AROM for wrist and digits in all planes            MC spreads , CT spreads and webspace soft tissue mobs done prior to  scar massage done and pt ed on doing at home- cica scar pad for night time to use under isotoner glove  Hand out provided  Pt to cont with contrast  prior to review of HEP at home  [facility] for [facility] flexion , intrinsic fist and composite fist to 2 fingers out of palm - pull on volar wrist under scar  Add and review PROM for wrist over edge of table for wrist flexion , ext, RD, UD -10 reps  Slight pull but pain less than 2/10  And then AROM for wrist in all planes - close hand and open hand flexion , ext RD, UD ,sup and pronation - pain keep under 2/10   Tendon glides - with fist to 2 cm foam block  And opposition  Pick up 1 cm foam block  Thumb  PA and RA AROM   pain fre          OT Education - 03/17/20 1711    Education Details progress and changes in HEP    Person(s) Educated Patient    Methods Explanation;Demonstration;Tactile cues;Verbal cues;Handout    Comprehension Verbal cues required;Returned demonstration;Verbalized understanding            OT Short Term Goals - 03/12/20 1504      OT SHORT TERM GOAL #1   Title Pt to be independent in HEP to decrease edema and increase AROM in digits flexion to touch palm and thumb to hold tool , plate and cup    Baseline decrease AROM in all digits flexion and thumb - see flowsheet-no knowledge of HEP    Time 3    Period Weeks    Status New    Target Date 04/02/20      OT SHORT TERM GOAL #2   Title R wrist AROM improve with more than 10-40 degrees to turn doorknob and use hand in more than 50% of ADL's    Baseline AROM or R wrist decrease in all motion- see flowsheet- cannot turn doorknob -and 30% use of R hand in bathingand dressing    Time 4    Period Weeks    Status New    Target Date 04/09/20             OT Long Term Goals - 03/12/20 1512      OT LONG TERM GOAL #  1   Title Pt to increase R hand grip strenght to more than 50% compare to L hand to grip tools and squeeze washclotth    Baseline NT grip - decrease fisting - only 4 1/2 wsks s/p    Time 8    Period Weeks    Status New    Target Date 05/07/20      OT LONG TERM GOAL #2   Title R wrist AROM improve  to [facility] to returning 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    Baseline R wrist AROM decrease in all planes -and using hand only in 30%    Time 8    Period Weeks    Status New    Target Date 05/07/20      OT LONG TERM GOAL #3   Title Function score on PRWHE improve with more than 30 point    Baseline at eval PRWHE function score is 42.5/50    Time 8    Period Weeks    Status New    Target Date 05/07/20                 Plan - 03/17/20 1712    Clinical Impression Statement Pt is 5 wks s/p R distal radius fx ORIF and lacerations - pt show increase AROM in R hand digits and wrist - wrist extention most impaired - and scar tissue adhere - long scar on volar wrist into CT - pt ed on scar managements and add PROM to wrist in all planes    OT Occupational Profile and History Problem Focused Assessment - Including review of records relating to presenting problem    Occupational performance deficits (Please refer to evaluation for details): ADL's;IADL's;Work;Play;Leisure    Body Structure / Function / Physical Skills ADL;ROM;UE functional use;Flexibility;FMC;Scar mobility;Dexterity;Edema;Pain;Strength;IADL    Rehab Potential Good    Clinical Decision Making Limited treatment options, no task modification necessary    Comorbidities Affecting Occupational Performance: None    Modification or Assistance to Complete Evaluation  No modification of tasks or assist necessary to complete eval    OT Frequency 2x / week    OT Duration 8 weeks    OT Treatment/Interventions Self-care/ADL training;Splinting;Patient/family education;Paraffin;Fluidtherapy;Contrast Bath;Manual Therapy;Passive range of motion;Scar mobilization;Therapeutic exercise    Plan assess progress with HEP a    Consulted and Agree with Plan of Care Patient           Patient will benefit from skilled therapeutic intervention in order to improve the following deficits and impairments:   Body Structure /  Function / Physical Skills: ADL, ROM, UE functional use, Flexibility, FMC, Scar mobility, Dexterity, Edema, Pain, Strength, IADL       Visit Diagnosis: Localized edema  Scar condition and fibrosis of skin  Stiffness of right hand, not elsewhere classified  Stiffness of right wrist, not elsewhere classified  Muscle weakness (generalized)  Pain in right hand    Problem List Patient Active Problem List   Diagnosis Date Noted  . Benign prostatic hyperplasia with lower urinary tract symptoms 06/18/2018  . History of elevated PSA 06/18/2018  . Atrial fibrillation with RVR ([place]) 09/18/2017  . Chest pain 09/18/2017  . GERD (gastroesophageal reflux disease) 09/18/2017  . OSA (obstructive sleep apnea) 09/18/2017    Rosalyn Gess OTR/L,CLT  03/17/2020, 5:20 PM  [facility] [address], [place], [address] Phone: [phone]   Fax:  [phone]  Name:  Elijah Nelson MRN: [id number] Date of Birth: [birth year]

## 2020-03-17 NOTE — Patient Instructions (Signed)
See note

## 2020-03-19 ENCOUNTER — Ambulatory Visit: Payer: Medicare Other | Attending: Physician Assistant | Admitting: Occupational Therapy

## 2020-03-19 ENCOUNTER — Other Ambulatory Visit: Payer: Self-pay

## 2020-03-19 DIAGNOSIS — L905 Scar conditions and fibrosis of skin: Secondary | ICD-10-CM | POA: Diagnosis present

## 2020-03-19 DIAGNOSIS — M6281 Muscle weakness (generalized): Secondary | ICD-10-CM | POA: Insufficient documentation

## 2020-03-19 DIAGNOSIS — M25641 Stiffness of right hand, not elsewhere classified: Secondary | ICD-10-CM | POA: Insufficient documentation

## 2020-03-19 DIAGNOSIS — M25631 Stiffness of right wrist, not elsewhere classified: Secondary | ICD-10-CM | POA: Insufficient documentation

## 2020-03-19 DIAGNOSIS — R600 Localized edema: Secondary | ICD-10-CM | POA: Diagnosis not present

## 2020-03-19 DIAGNOSIS — M79641 Pain in right hand: Secondary | ICD-10-CM | POA: Insufficient documentation

## 2020-03-19 NOTE — Patient Instructions (Signed)
Same

## 2020-03-19 NOTE — Therapy (Signed)
[facility] [address] [place], [address] Phone: [phone]   Fax:  [phone]  Occupational Therapy Treatment  Patient Details  Name: Elijah Nelson MRN: [id number] Date of Birth: Nov 30, 1940 Referring Provider (OT): Candace Gallus PA    Encounter Date: 03/19/2020   OT End of Session - 03/19/20 1606    Visit Number 3    Number of Visits 16    Date for OT Re-Evaluation 05/07/20    OT Start Time 1430    OT Stop Time 1521    OT Time Calculation (min) 51 min    Activity Tolerance Patient tolerated treatment well    Behavior During Therapy [facility] for tasks assessed/performed           Past Medical History:  Diagnosis Date  . Arthritis    neck  . Atrial fibrillation ([place])   . BPH (benign prostatic hyperplasia)   . Chronic headache   . GERD (gastroesophageal reflux disease)   . History of skin cancer   . History of skin cancer   . OSA (obstructive sleep apnea)    CPAP    Past Surgical History:  Procedure Laterality Date  . CATARACT EXTRACTION W/PHACO Right 07/23/2019   Procedure: CATARACT EXTRACTION PHACO AND INTRAOCULAR LENS PLACEMENT ([place]) RIGHT;  Surgeon: Birder Robson, MD;  Location: [place];  Service: Ophthalmology;  Laterality: Right;  1:10 16.7% 11.84  . CATARACT EXTRACTION W/PHACO Left 08/13/2019   Procedure: CATARACT EXTRACTION PHACO AND INTRAOCULAR LENS PLACEMENT ([facility]) LEFT 6.26  00:42.6;  Surgeon: Birder Robson, MD;  Location: [place];  Service: Ophthalmology;  Laterality: Left;  sleep apnea  . HERNIA REPAIR      There were no vitals filed for this visit.   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 Yes    Pain Score 2     Pain Location --    Pain Orientation Right    Pain Descriptors / Indicators Aching;Tender;Tightness    Pain Type Acute pain;Surgical pain    Pain Onset More than a month ago    Pain Frequency Constant              [facility] OT Assessment - 03/19/20 0001      AROM   Right Forearm Pronation 90 Degrees    Right Forearm Supination 80 Degrees    Right Wrist Extension 35 Degrees    Right Wrist Flexion 58 Degrees   in session   Right Wrist Radial Deviation 17 Degrees    Right Wrist Ulnar Deviation 24 Degrees            cont to show progress in AROM for digits flexion and wrist - see flowsheet  OT Treatments/Exercises (OP) - 03/19/20 0001      RUE Fluidotherapy   Number Minutes Fluidotherapy 8 Minutes    RUE Fluidotherapy Location Hand;Wrist    Comments AROM for wrist and hand prior to soft tissue and HEP review            MC spreads , CT spreads and webspace soft tissue mobs - wife present to ed to do at home scar massage done -used xtractor and mini massager on scar on wrist and forearm this date  and pt /wife ed on doing at home- cica scar pad for night time to use under isotoner glove  And kinesiotaping done and ed on doing at home- 30% pull parallel and 4 across at 100% pull to  Hand out provided last time on scar massage  contrast prior  HEP at home  [facility] for [facility] flexion , intrinsic fist and composite fist to 1 finger out of palm - pull on volar wrist under scar - min a to keep wrist neutral Cont with PROM for wrist over edge of table for wrist flexion , ext, RD, UD -10 reps  Slight  pull but pain less than 2/10  And then AROM for wrist in all planes - close hand and open hand flexion , ext RD, UD ,sup and pronation - pain keep under 2/10   Tendon glides - with fist to 1 cm foam block  And opposition  Pick up 1 cm foam block  Thumb PA and RA AROM  pain free or less than 2/10 slight pull    Can do xtra some prayer stretch -and wrist flexion  PROM         OT Education - 03/19/20 1605    Education Details progress and changes in HEP    Person(s) Educated Patient    Methods Explanation;Demonstration;Tactile cues;Verbal cues;Handout    Comprehension Verbal cues required;Returned demonstration;Verbalized understanding            OT Short Term Goals - 03/12/20 1504      OT SHORT TERM GOAL #1   Title Pt to be independent in HEP to decrease edema and increase AROM in digits flexion to touch palm and thumb to hold tool , plate and cup    Baseline decrease AROM in all digits flexion and thumb - see flowsheet-no knowledge of HEP    Time 3    Period Weeks    Status New    Target Date 04/02/20      OT SHORT TERM GOAL #2   Title R wrist AROM improve with more than 10-40 degrees to turn doorknob and use hand in more than 50% of ADL's    Baseline AROM or R wrist decrease in all motion- see flowsheet- cannot turn doorknob -and 30% use of R hand in bathingand dressing    Time 4    Period Weeks    Status New    Target Date 04/09/20             OT Long Term Goals - 03/12/20 1512      OT LONG TERM GOAL #1   Title Pt to increase R hand grip strenght to more than 50% compare to L hand to grip tools and squeeze washclotth    Baseline NT grip - decrease fisting - only 4 1/2 wsks s/p    Time 8    Period Weeks    Status New    Target Date 05/07/20      OT  LONG TERM GOAL #2   Title R wrist AROM improve to [facility] to returning 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    Baseline R wrist AROM decrease in all planes -and using hand only in 30%      Time 8    Period Weeks    Status New    Target Date 05/07/20      OT LONG TERM GOAL #3   Title Function score on PRWHE improve with more than 30 point    Baseline at eval PRWHE function score is 42.5/50    Time 8    Period Weeks    Status New    Target Date 05/07/20                 Plan - 03/19/20 1606    Clinical Impression Statement Pt is 5 /2 wks s/p R distal radius fx with ORIF and lacerations - pt cont to show increase R wrist AROM in all planes , digits flexion - scar still adhere  and improving- cont with same HEP but add kinesiotape to scar management during day    OT Occupational Profile and History Problem Focused Assessment - Including review of records relating to presenting problem    Occupational performance deficits (Please refer to evaluation for details): ADL's;IADL's;Work;Play;Leisure    Body Structure / Function / Physical Skills ADL;ROM;UE functional use;Flexibility;FMC;Scar mobility;Dexterity;Edema;Pain;Strength;IADL    Rehab Potential Good    Clinical Decision Making Limited treatment options, no task modification necessary    Comorbidities Affecting Occupational Performance: None    Modification or Assistance to Complete Evaluation  No modification of tasks or assist necessary to complete eval    OT Frequency 2x / week    OT Duration 8 weeks    OT Treatment/Interventions Self-care/ADL training;Splinting;Patient/family education;Paraffin;Fluidtherapy;Contrast Bath;Manual Therapy;Passive range of motion;Scar mobilization;Therapeutic exercise    Plan assess progress with HEP a           Patient will benefit from skilled therapeutic intervention in order to improve the following deficits and impairments:   Body Structure / Function / Physical Skills: ADL, ROM, UE functional use, Flexibility, FMC, Scar mobility, Dexterity, Edema, Pain, Strength, IADL       Visit Diagnosis: Localized edema  Scar condition and fibrosis of skin  Stiffness of right  hand, not elsewhere classified  Stiffness of right wrist, not elsewhere classified  Muscle weakness (generalized)  Pain in right hand    Problem List Patient Active Problem List   Diagnosis Date Noted  . Benign prostatic hyperplasia with lower urinary tract symptoms 06/18/2018  . History of elevated PSA 06/18/2018  . Atrial fibrillation with RVR ([place]) 09/18/2017  . Chest pain 09/18/2017  . GERD (gastroesophageal reflux disease) 09/18/2017  . OSA (obstructive sleep apnea) 09/18/2017    Rosalyn Gess OTR/l,CLT  03/19/2020, 4:10 PM  [facility] [address] [place], [address] Phone: [phone]   Fax:  [phone]  Name: Elijah Nelson MRN: [id number] Date of Birth: [birth year]

## 2020-03-24 ENCOUNTER — Ambulatory Visit: Payer: Medicare Other | Admitting: Occupational Therapy

## 2020-03-24 ENCOUNTER — Other Ambulatory Visit: Payer: Self-pay

## 2020-03-24 DIAGNOSIS — M25631 Stiffness of right wrist, not elsewhere classified: Secondary | ICD-10-CM

## 2020-03-24 DIAGNOSIS — R600 Localized edema: Secondary | ICD-10-CM | POA: Diagnosis not present

## 2020-03-24 DIAGNOSIS — M25641 Stiffness of right hand, not elsewhere classified: Secondary | ICD-10-CM

## 2020-03-24 DIAGNOSIS — L905 Scar conditions and fibrosis of skin: Secondary | ICD-10-CM

## 2020-03-24 DIAGNOSIS — M6281 Muscle weakness (generalized): Secondary | ICD-10-CM

## 2020-03-24 DIAGNOSIS — M79641 Pain in right hand: Secondary | ICD-10-CM

## 2020-03-24 NOTE — Therapy (Signed)
[facility] [address], [place], [address] Phone: [phone]   Fax:  [phone]  Occupational Therapy Treatment  Patient Details  Name: Elijah Nelson MRN: [id number] Date of Birth: Apr 29, 1941 Referring Provider (OT): Candace Gallus PA    Encounter Date: 03/24/2020   OT End of Session - 03/24/20 1523    Visit Number 4    Number of Visits 16    Date for OT Re-Evaluation 05/07/20    OT Start Time 1508    OT Stop Time 1549    OT Time Calculation (min) 41 min    Activity Tolerance Patient tolerated treatment well           Past Medical History:  Diagnosis Date  . Arthritis    neck  . Atrial fibrillation ([place])   . BPH (benign prostatic hyperplasia)   . Chronic headache   . GERD (gastroesophageal reflux disease)   . History of skin cancer   . History of skin cancer   . OSA (obstructive sleep apnea)    CPAP    Past Surgical History:  Procedure Laterality Date  . CATARACT EXTRACTION W/PHACO Right 07/23/2019   Procedure: CATARACT EXTRACTION PHACO AND INTRAOCULAR LENS PLACEMENT ([place]) RIGHT;  Surgeon: Birder Robson, MD;  Location: [place];  Service: Ophthalmology;  Laterality: Right;  1:10 16.7% 11.84  . CATARACT EXTRACTION W/PHACO Left 08/13/2019   Procedure: CATARACT EXTRACTION PHACO AND INTRAOCULAR LENS PLACEMENT ([facility]) LEFT 6.26  00:42.6;  Surgeon: Birder Robson, MD;  Location: [place];  Service: Ophthalmology;  Laterality: Left;  sleep apnea  . HERNIA REPAIR      There were no vitals filed for this visit.   Subjective Assessment - 03/24/20 1519    Subjective  My hand swelling little worse today - but I am using it more- my H/A had been worse lately - my more motion    Pertinent History 02/08/2020 had accident working on fan when wrench was caught in blades resulting in distal R radius fx - ORIF and plastic surgery were done 5/23 - and was in long cast then short  cast - and stitches taken out 03/10/20 - and in removal wrist splint now -refer to OT to start hand therapy    Patient Stated Goals want to be albe to use my R hand to use tools, squeeze , lift , carry and pull/push objects    Currently in Pain? Yes    Pain Location Hand   wrist   Pain Orientation Right    Pain Descriptors / Indicators Aching    Pain Type Acute pain;Surgical pain    Pain Onset More than a month ago              [facility] OT Assessment - 03/24/20 0001      AROM   Right Forearm Pronation 90 Degrees    Right Forearm Supination 85 Degrees    Right Wrist Extension 40 Degrees    Right Wrist Flexion 60 Degrees      Right Hand AROM   R Index  MCP 0-90 75 Degrees    R Index PIP 0-100 85 Degrees    R Long  MCP 0-90 80 Degrees    R Long PIP 0-100 90 Degrees    R Ring  MCP 0-90 80 Degrees    R Ring PIP 0-100 90 Degrees    R Little  MCP 0-90 80 Degrees    R Little PIP  0-100 90 Degrees      Left Hand AROM   L Index  MCP 0-90 75 Degrees    L Index PIP 0-100 85 Degrees    L Long  MCP 0-90 80 Degrees    L Long PIP 0-100 90 Degrees    L Ring  MCP 0-90 80 Degrees    L Ring PIP 0-100 90 Degrees    L Little  MCP 0-90 80 Degrees    L Little PIP 0-100 90 Degrees           cont to show increase AROM for wrist in all planes and digits flexion           OT Treatments/Exercises (OP) - 03/24/20 0001      RUE Contrast Bath   Time 9 minutes    Comments prior to soft tissue           MC spreads , CT spreads and webspace soft tissue mobs -  scar massage done - this date used some light graston tool nr 2 brushing and sweeping And mini massager on scar on wrist and forearm this date  Cont with cica scar pad for night time to use under isotoner glove  And kinesiotaping change this date to do for the next 2-3 days on volar wrist scar -and not forearm - 2 redness area on volar forearm from taping this past weekend  Scar mobility improving but limiting wrist extention more  than flexion     30% pull parallel and 4 across at 100% pull over scar 90 degrees   contrast prior  HEPat home and this date because of increase edema in hand and wrist this date   AAROM for MC flexion , intrinsic fist and composite fist to 1 finger out of palm - pull on volar wrist under scar - min a to keep wrist neutral Cont with PROM for wrist over edge of table for wrist flexion , ext, RD, UD -10 reps  Slight pull but pain less than 2/10 Review and add to HEP 1 lbs weight or 16oz hammer for wrist in all planes - pain keep under 2/10 10-12 reps   2 x day            OT Education - 03/24/20 1522    Education Details progress and changes in HEP    Person(s) Educated Patient    Methods Explanation;Demonstration;Tactile cues;Verbal cues;Handout    Comprehension Verbal cues required;Returned demonstration;Verbalized understanding            OT Short Term Goals - 03/12/20 1504      OT SHORT TERM GOAL #1   Title Pt to be independent in HEP to decrease edema and increase AROM in digits flexion to touch palm and thumb to hold tool , plate and cup    Baseline decrease AROM in all digits flexion and thumb - see flowsheet-no knowledge of HEP    Time 3    Period Weeks    Status New    Target Date 04/02/20      OT SHORT TERM GOAL #2   Title R wrist AROM improve with more than 10-40 degrees to turn doorknob and use hand in more than 50% of ADL's    Baseline AROM or R wrist decrease in all motion- see flowsheet- cannot turn doorknob -and 30% use of R hand in bathingand dressing    Time 4    Period Weeks    Status New    Target  Date 04/09/20             OT Long Term Goals - 03/12/20 1512      OT LONG TERM GOAL #1   Title Pt to increase R hand grip strenght to more than 50% compare to L hand to grip tools and squeeze washclotth    Baseline NT grip - decrease fisting - only 4 1/2 wsks s/p    Time 8    Period Weeks    Status New    Target Date 05/07/20      OT LONG  TERM GOAL #2   Title R wrist AROM improve to [facility] to returning 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    Baseline R wrist AROM decrease in all planes -and using hand only in 30%    Time 8    Period Weeks    Status New    Target Date 05/07/20      OT LONG TERM GOAL #3   Title Function score on PRWHE improve with more than 30 point    Baseline at eval PRWHE function score is 42.5/50    Time 8    Period Weeks    Status New    Target Date 05/07/20                 Plan - 03/24/20 1707    Clinical Impression Statement Pt is 6 wks s/p R distal radius fx with ORIF and lacerations - pt show increase R digits and wrist AROM in all planes - cont to have thick scar adhesions on volar wrist and forearm limiting his wrist extention - cont to have some edema - but able to initiate strengthening for wrist this date    OT Occupational Profile and History Problem Focused Assessment - Including review of records relating to presenting problem    Occupational performance deficits (Please refer to evaluation for details): ADL's;IADL's;Work;Play;Leisure    Body Structure / Function / Physical Skills ADL;ROM;UE functional use;Flexibility;FMC;Scar mobility;Dexterity;Edema;Pain;Strength;IADL    Rehab Potential Good    Clinical Decision Making Limited treatment options, no task modification necessary    Comorbidities Affecting Occupational Performance: None    Modification or Assistance to Complete Evaluation  No modification of tasks or assist necessary to complete eval    OT Frequency 2x / week    OT Duration 8 weeks    OT Treatment/Interventions Self-care/ADL training;Splinting;Patient/family education;Paraffin;Fluidtherapy;Contrast Bath;Manual Therapy;Passive range of motion;Scar mobilization;Therapeutic exercise    Plan assess progress with HEP a    Consulted and Agree with Plan of Care Patient           Patient will benefit from skilled therapeutic intervention in order  to improve the following deficits and impairments:   Body Structure / Function / Physical Skills: ADL, ROM, UE functional use, Flexibility, FMC, Scar mobility, Dexterity, Edema, Pain, Strength, IADL       Visit Diagnosis: Localized edema  Scar condition and fibrosis of skin  Stiffness of right hand, not elsewhere classified  Stiffness of right wrist, not elsewhere classified  Muscle weakness (generalized)  Pain in right hand    Problem List Patient Active Problem List   Diagnosis Date Noted  . Benign prostatic hyperplasia with lower urinary tract symptoms 06/18/2018  . History of elevated PSA 06/18/2018  . Atrial fibrillation with RVR ([place]) 09/18/2017  . Chest pain 09/18/2017  . GERD (gastroesophageal reflux disease) 09/18/2017  . OSA (obstructive sleep apnea) 09/18/2017    Rosalyn Gess  OTR/L,CLT 03/24/2020, 5:11 PM  [facility] [address] [place], [address] Phone: [phone]   Fax:  [phone]  Name: Elijah Nelson MRN: [id number] Date of Birth: 08-30-1941

## 2020-03-26 ENCOUNTER — Ambulatory Visit: Payer: Medicare Other | Admitting: Occupational Therapy

## 2020-03-26 ENCOUNTER — Other Ambulatory Visit: Payer: Self-pay

## 2020-03-26 DIAGNOSIS — M25631 Stiffness of right wrist, not elsewhere classified: Secondary | ICD-10-CM

## 2020-03-26 DIAGNOSIS — L905 Scar conditions and fibrosis of skin: Secondary | ICD-10-CM

## 2020-03-26 DIAGNOSIS — M79641 Pain in right hand: Secondary | ICD-10-CM

## 2020-03-26 DIAGNOSIS — R600 Localized edema: Secondary | ICD-10-CM

## 2020-03-26 DIAGNOSIS — M6281 Muscle weakness (generalized): Secondary | ICD-10-CM

## 2020-03-26 DIAGNOSIS — M25641 Stiffness of right hand, not elsewhere classified: Secondary | ICD-10-CM

## 2020-03-26 NOTE — Therapy (Signed)
[facility] [address], [place], [address] Phone: [phone]   Fax:  [phone]  Occupational Therapy Treatment  Patient Details  Name: Elijah Nelson MRN: [id number] Date of Birth: 23-Feb-1941 Referring Provider (OT): Candace Gallus PA    Encounter Date: 03/26/2020   OT End of Session - 03/26/20 1549    Visit Number 5    Number of Visits 16    Date for OT Re-Evaluation 05/07/20    OT Start Time 7106    OT Stop Time 1644    OT Time Calculation (min) 55 min    Activity Tolerance Patient tolerated treatment well    Behavior During Therapy [facility] for tasks assessed/performed           Past Medical History:  Diagnosis Date  . Arthritis    neck  . Atrial fibrillation ([place])   . BPH (benign prostatic hyperplasia)   . Chronic headache   . GERD (gastroesophageal reflux disease)   . History of skin cancer   . History of skin cancer   . OSA (obstructive sleep apnea)    CPAP    Past Surgical History:  Procedure Laterality Date  . CATARACT EXTRACTION W/PHACO Right 07/23/2019   Procedure: CATARACT EXTRACTION PHACO AND INTRAOCULAR LENS PLACEMENT ([place]) RIGHT;  Surgeon: Birder Robson, MD;  Location: [place];  Service: Ophthalmology;  Laterality: Right;  1:10 16.7% 11.84  . CATARACT EXTRACTION W/PHACO Left 08/13/2019   Procedure: CATARACT EXTRACTION PHACO AND INTRAOCULAR LENS PLACEMENT ([facility]) LEFT 6.26  00:42.6;  Surgeon: Birder Robson, MD;  Location: [place];  Service: Ophthalmology;  Laterality: Left;  sleep apnea  . HERNIA REPAIR      There were no vitals filed for this visit.   Subjective Assessment - 03/26/20 1758    Subjective  My hand are so stiff in the am -    Pertinent History 02/08/2020 had accident working on fan when wrench was caught in blades resulting in distal R radius fx - ORIF and plastic surgery were done 5/23 - and was in long cast then short cast - and  stitches taken out 03/10/20 - and in removal wrist splint now -refer to OT to start hand therapy    Patient Stated Goals want to be albe to use my R hand to use tools, squeeze , lift , carry and pull/push objects    Currently in Pain? No/denies              [facility] OT Assessment - 03/26/20 0001      AROM   Right Forearm Pronation 90 Degrees    Right Wrist Extension 42 Degrees    Right Wrist Flexion 70 Degrees    Right Wrist Radial Deviation 19 Degrees    Right Wrist Ulnar Deviation 30 Degrees           grip R 32, L 101lbs  Lat grip L 29, R 16 lbs 3 point grip L 20 , R 12 lbs         OT Treatments/Exercises (OP) - 03/26/20 0001      RUE Fluidotherapy   Number Minutes Fluidotherapy 8 Minutes    RUE Fluidotherapy Location Hand;Wrist    Comments AROM to decrease stiffness            MC spreads , CT spreads and webspace soft tissue mobs-  scar massage done- this date used some light graston tool nr 2 brushing and sweeping And mini  massager on scar on wrist and forearm this date Cont with cica scar pad for night time to use under isotoner glove And kinesiotaping change last time  2-3 days on volar wrist scar -and not forearm - 2 redness area on volar forearm from taping this past weekend - cont same  Scar mobility improving but limiting wrist extention more than flexion     30% pull parallel and 4 across at 100% pull over scar 90 degrees   contrast prior HEPat home and this date because of increase edema in hand and wrist this date   AAROM for MC flexion , intrinsic fist and composite fist to42finger out of palm - pull on volar wrist under scar - min a to keep wrist neutral Add composite PROM flexion to digits- pain less than 2/10  Cont withPROM for wrist over edge of table for wrist flexion , ext, RD, UD -10 reps  - double on  extention than flexion  Slight pull but pain less than 2/10 Review and add to HEP 1 lbs weight or 16oz hammer for wrist in all  planes last time - pain keep under 2/10 10-12 reps   2 x day  Can increase to 2 sets -and check next week 3 sets  And then end of next week prior to vacation 2 lbs  CPM for 200 sec x 2 for wrist extention           OT Education - 03/26/20 1820    Education Details progress and changes in HEP    Person(s) Educated Patient    Methods Explanation;Demonstration;Tactile cues;Verbal cues;Handout    Comprehension Verbal cues required;Returned demonstration;Verbalized understanding            OT Short Term Goals - 03/12/20 1504      OT SHORT TERM GOAL #1   Title Pt to be independent in HEP to decrease edema and increase AROM in digits flexion to touch palm and thumb to hold tool , plate and cup    Baseline decrease AROM in all digits flexion and thumb - see flowsheet-no knowledge of HEP    Time 3    Period Weeks    Status New    Target Date 04/02/20      OT SHORT TERM GOAL #2   Title R wrist AROM improve with more than 10-40 degrees to turn doorknob and use hand in more than 50% of ADL's    Baseline AROM or R wrist decrease in all motion- see flowsheet- cannot turn doorknob -and 30% use of R hand in bathingand dressing    Time 4    Period Weeks    Status New    Target Date 04/09/20             OT Long Term Goals - 03/12/20 1512      OT LONG TERM GOAL #1   Title Pt to increase R hand grip strenght to more than 50% compare to L hand to grip tools and squeeze washclotth    Baseline NT grip - decrease fisting - only 4 1/2 wsks s/p    Time 8    Period Weeks    Status New    Target Date 05/07/20      OT LONG TERM GOAL #2   Title R wrist AROM improve to [facility] to returning 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    Baseline R wrist AROM decrease in all planes -and using hand only in  30%    Time 8    Period Weeks    Status New    Target Date 05/07/20      OT LONG TERM GOAL #3   Title Function score on PRWHE improve with more than 30 point     Baseline at eval PRWHE function score is 42.5/50    Time 8    Period Weeks    Status New    Target Date 05/07/20                 Plan - 03/26/20 1821    Clinical Impression Statement Pt is 6 1/2 wks s/p R distal radius fx with ORIF and laceration - making great progress but limited in wrist extention by scar adhesions volar wrist and forearm - increase edema and stiffness in wrist and diigts -pt to focus on wrist extention , composite flexion of digits    OT Occupational Profile and History Problem Focused Assessment - Including review of records relating to presenting problem    Occupational performance deficits (Please refer to evaluation for details): ADL's;IADL's;Work;Play;Leisure    Body Structure / Function / Physical Skills ADL;ROM;UE functional use;Flexibility;FMC;Scar mobility;Dexterity;Edema;Pain;Strength;IADL    Rehab Potential Good    Clinical Decision Making Limited treatment options, no task modification necessary    Comorbidities Affecting Occupational Performance: None    Modification or Assistance to Complete Evaluation  No modification of tasks or assist necessary to complete eval    OT Frequency 2x / week    OT Duration 8 weeks    OT Treatment/Interventions Self-care/ADL training;Splinting;Patient/family education;Paraffin;Fluidtherapy;Contrast Bath;Manual Therapy;Passive range of motion;Scar mobilization;Therapeutic exercise    Plan assess progress with HEP a    Consulted and Agree with Plan of Care Patient           Patient will benefit from skilled therapeutic intervention in order to improve the following deficits and impairments:   Body Structure / Function / Physical Skills: ADL, ROM, UE functional use, Flexibility, FMC, Scar mobility, Dexterity, Edema, Pain, Strength, IADL       Visit Diagnosis: Localized edema  Scar condition and fibrosis of skin  Stiffness of right hand, not elsewhere classified  Stiffness of right wrist, not elsewhere  classified  Muscle weakness (generalized)  Pain in right hand    Problem List Patient Active Problem List   Diagnosis Date Noted  . Benign prostatic hyperplasia with lower urinary tract symptoms 06/18/2018  . History of elevated PSA 06/18/2018  . Atrial fibrillation with RVR ([place]) 09/18/2017  . Chest pain 09/18/2017  . GERD (gastroesophageal reflux disease) 09/18/2017  . OSA (obstructive sleep apnea) 09/18/2017    Rosalyn Gess OTR/l,CLT  03/26/2020, 6:23 PM  [facility] [address], [place], [address] Phone: [phone]   Fax:  [phone]  Name: Elijah Nelson MRN: [id number] Date of Birth: Oct 17, 1940

## 2020-03-31 ENCOUNTER — Encounter: Payer: Self-pay | Admitting: Occupational Therapy

## 2020-03-31 ENCOUNTER — Other Ambulatory Visit: Payer: Self-pay

## 2020-03-31 ENCOUNTER — Ambulatory Visit: Payer: Medicare Other | Admitting: Occupational Therapy

## 2020-03-31 DIAGNOSIS — L905 Scar conditions and fibrosis of skin: Secondary | ICD-10-CM

## 2020-03-31 DIAGNOSIS — R600 Localized edema: Secondary | ICD-10-CM

## 2020-03-31 DIAGNOSIS — M79641 Pain in right hand: Secondary | ICD-10-CM

## 2020-03-31 DIAGNOSIS — M25641 Stiffness of right hand, not elsewhere classified: Secondary | ICD-10-CM

## 2020-03-31 DIAGNOSIS — M25631 Stiffness of right wrist, not elsewhere classified: Secondary | ICD-10-CM

## 2020-03-31 DIAGNOSIS — M6281 Muscle weakness (generalized): Secondary | ICD-10-CM

## 2020-03-31 NOTE — Therapy (Signed)
[facility] [address] [place], [address] Phone: [phone]   Fax:  [phone]  Occupational Therapy Treatment  Patient Details  Name: Elijah Nelson MRN: [id number] Date of Birth: August 19, 1941 Referring Provider (OT): Candace Gallus PA    Encounter Date: 03/31/2020   OT End of Session - 04/02/20 0808    Visit Number 6    Number of Visits 16    Date for OT Re-Evaluation 05/07/20    OT Start Time 1302    OT Stop Time 1345    OT Time Calculation (min) 43 min    Activity Tolerance Patient tolerated treatment well    Behavior During Therapy Elijah Nelson for tasks assessed/performed           Past Medical History:  Diagnosis Date  . Arthritis    neck  . Atrial fibrillation ([place])   . BPH (benign prostatic hyperplasia)   . Chronic headache   . GERD (gastroesophageal reflux disease)   . History of skin cancer   . History of skin cancer   . OSA (obstructive sleep apnea)    CPAP    Past Surgical History:  Procedure Laterality Date  . CATARACT EXTRACTION W/PHACO Right 07/23/2019   Procedure: CATARACT EXTRACTION PHACO AND INTRAOCULAR LENS PLACEMENT ([place]) RIGHT;  Surgeon: Birder Robson, MD;  Location: [facility];  Service: Ophthalmology;  Laterality: Right;  1:10 16.7% 11.84  . CATARACT EXTRACTION W/PHACO Left 08/13/2019   Procedure: CATARACT EXTRACTION PHACO AND INTRAOCULAR LENS PLACEMENT ([facility]) LEFT 6.26  00:42.6;  Surgeon: Birder Robson, MD;  Location: [facility];  Service: Ophthalmology;  Laterality: Left;  sleep apnea  . HERNIA REPAIR      There were no vitals filed for this visit.   Subjective Assessment - 04/02/20 0807    Subjective  Patient denies any pain this date at beginning of session, reports hand has some achiness at times but not at the moment.  Patient reports some pain with hammer exercise, feels it aggravates the nerve, sharp pain.  He states he had it prior to  injury but not sure what caused it.    Pertinent History 02/08/2020 had accident working on fan when wrench was caught in blades resulting in distal R radius fx - ORIF and plastic surgery were done 5/23 - and was in long cast then short cast - and stitches taken out 03/10/20 - and in removal wrist splint now -refer to OT to start hand therapy    Patient Stated Goals want to be albe to use my R hand to use tools, squeeze , lift , carry and pull/push objects    Currently in Pain? No/denies    Pain Score 0-No pain           Pain with hammer exercises at home on ulnar side of the hand.  Switched this date to ROM only for the next few days and reassess next session regarding pain and motion.    Contrast this date for 11 mins prior to manual therapy secondary to increased edema.   Patient seen for manual therapy with therapist performing [place] spreads , CT spreads and webspace soft tissue mobs scar massage performed followed by light graston tool nr 2 brushing and sweeping mini massager on scar on wrist and forearm this date Cont withcica scar pad for night time to use under isotoner glove Scar mobility continues to improve but limiting wrist extension more than flexion   AAROM for  MC flexion , intrinsic fist and composite fist to41finger out of palm - pull on volar wrist under scar, cues to keep wrist neutral Composite PROM flexion to digits with target of pain less than 2/10  PROM for wrist over edge of table for wrist flexion , ext, RD, UD -10 reps  continue with 2 times extension than flexion   Patient with increased pain and irritation with use of 1# hammer from last session, active motion only for the next couple days and then reassess next session.   Patient will be going on vacation next week for 2 weeks, will plan ahead and provide additional instructions next session for HEP during vacation time.    CPM for 200 sec x 2 for wrist extension                       OT  Education - 04/03/20 0807    Education Details HEP, scar massage    Person(s) Educated Patient    Methods Explanation;Demonstration;Tactile cues;Verbal cues;Handout    Comprehension Verbal cues required;Returned demonstration;Verbalized understanding            OT Short Term Goals - 03/12/20 1504      OT SHORT TERM GOAL #1   Title Pt to be independent in HEP to decrease edema and increase AROM in digits flexion to touch palm and thumb to hold tool , plate and cup    Baseline decrease AROM in all digits flexion and thumb - see flowsheet-no knowledge of HEP    Time 3    Period Weeks    Status New    Target Date 04/02/20      OT SHORT TERM GOAL #2   Title R wrist AROM improve with more than 10-40 degrees to turn doorknob and use hand in more than 50% of ADL's    Baseline AROM or R wrist decrease in all motion- see flowsheet- cannot turn doorknob -and 30% use of R hand in bathingand dressing    Time 4    Period Weeks    Status New    Target Date 04/09/20             OT Long Term Goals - 03/12/20 1512      OT LONG TERM GOAL #1   Title Pt to increase R hand grip strenght to more than 50% compare to L hand to grip tools and squeeze washclotth    Baseline NT grip - decrease fisting - only 4 1/2 wsks s/p    Time 8    Period Weeks    Status New    Target Date 05/07/20      OT LONG TERM GOAL #2   Title R wrist AROM improve to [facility] to returning 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    Baseline R wrist AROM decrease in all planes -and using hand only in 30%    Time 8    Period Weeks    Status New    Target Date 05/07/20      OT LONG TERM GOAL #3   Title Function score on PRWHE improve with more than 30 point    Baseline at eval PRWHE function score is 42.5/50    Time 8    Period Weeks    Status New    Target Date 05/07/20                 Plan - 04/02/20 3893  Clinical Impression Statement Patient 7 weeks s/p right distal radius fx with  ORIF and laceration.  Pt with increased edema this date and reports doing some work in the yard and hand was more in a dependent position.  Patient also complaining of pain when performing supination/pronation with hammer and increased irritation to ulnar side of the hand.  Recommend pt go back to AROM for this motion over the next couple days with rest and then we can reassess next session.  Patient is doing well with remaining exercises, scar massage and contrast at home.  Continue to work towards goals in plan of care to decrease edema, increase active motion, restore strength and improve functional use of hand for necessary daily tasks.  Will provide additional instructions for HEP next session for upcoming vacation.    OT Occupational Profile and History Problem Focused Assessment - Including review of records relating to presenting problem    Occupational performance deficits (Please refer to evaluation for details): ADL's;IADL's;Work;Play;Leisure    Body Structure / Function / Physical Skills ADL;ROM;UE functional use;Flexibility;FMC;Scar mobility;Dexterity;Edema;Pain;Strength;IADL    Rehab Potential Good    Clinical Decision Making Limited treatment options, no task modification necessary    Comorbidities Affecting Occupational Performance: None    Modification or Assistance to Complete Evaluation  No modification of tasks or assist necessary to complete eval    OT Frequency 2x / week    OT Duration 8 weeks    OT Treatment/Interventions Self-care/ADL training;Splinting;Patient/family education;Paraffin;Fluidtherapy;Contrast Bath;Manual Therapy;Passive range of motion;Scar mobilization;Therapeutic exercise    Plan assess progress with HEP a    Consulted and Agree with Plan of Care Patient           Patient will benefit from skilled therapeutic intervention in order to improve the following deficits and impairments:   Body Structure / Function / Physical Skills: ADL, ROM, UE functional use,  Flexibility, FMC, Scar mobility, Dexterity, Edema, Pain, Strength, IADL       Visit Diagnosis: Localized edema  Scar condition and fibrosis of skin  Stiffness of right hand, not elsewhere classified  Stiffness of right wrist, not elsewhere classified  Muscle weakness (generalized)  Pain in right hand    Problem List Patient Active Problem List   Diagnosis Date Noted  . Benign prostatic hyperplasia with lower urinary tract symptoms 06/18/2018  . History of elevated PSA 06/18/2018  . Atrial fibrillation with RVR ([place]) 09/18/2017  . Chest pain 09/18/2017  . GERD (gastroesophageal reflux disease) 09/18/2017  . OSA (obstructive sleep apnea) 09/18/2017   Elijah Nelson, Elijah Nelson, Elijah Nelson  Elijah Nelson 04/03/2020, 8:24 AM  [facility] [address] [place], [address] Phone: [phone]   Fax:  [phone]  Name: Elijah Nelson MRN: [id number] Date of Birth: [birth year]

## 2020-04-02 ENCOUNTER — Ambulatory Visit: Payer: Medicare Other | Admitting: Occupational Therapy

## 2020-04-02 ENCOUNTER — Other Ambulatory Visit: Payer: Self-pay

## 2020-04-02 DIAGNOSIS — R600 Localized edema: Secondary | ICD-10-CM

## 2020-04-02 DIAGNOSIS — M79641 Pain in right hand: Secondary | ICD-10-CM

## 2020-04-02 DIAGNOSIS — L905 Scar conditions and fibrosis of skin: Secondary | ICD-10-CM

## 2020-04-02 DIAGNOSIS — M6281 Muscle weakness (generalized): Secondary | ICD-10-CM

## 2020-04-02 DIAGNOSIS — M25631 Stiffness of right wrist, not elsewhere classified: Secondary | ICD-10-CM

## 2020-04-02 DIAGNOSIS — M25641 Stiffness of right hand, not elsewhere classified: Secondary | ICD-10-CM

## 2020-04-03 ENCOUNTER — Encounter: Payer: Self-pay | Admitting: Occupational Therapy

## 2020-04-03 NOTE — Therapy (Signed)
[facility] [address], [place], [address] Phone: [phone]   Fax:  [phone]  Occupational Therapy Treatment  Patient Details  Name: Elijah Nelson MRN: [id number] Date of Birth: June 15, 1941 Referring Provider (OT): Candace Gallus PA    Encounter Date: 04/02/2020   OT End of Session - 04/03/20 1256    Visit Number 7    Number of Visits 16    Date for OT Re-Evaluation 05/07/20    OT Start Time 1300    OT Stop Time 1345    OT Time Calculation (min) 45 min    Activity Tolerance Patient tolerated treatment well    Behavior During Therapy [facility] for tasks assessed/performed           Past Medical History:  Diagnosis Date  . Arthritis    neck  . Atrial fibrillation ([place])   . BPH (benign prostatic hyperplasia)   . Chronic headache   . GERD (gastroesophageal reflux disease)   . History of skin cancer   . History of skin cancer   . OSA (obstructive sleep apnea)    CPAP    Past Surgical History:  Procedure Laterality Date  . CATARACT EXTRACTION W/PHACO Right 07/23/2019   Procedure: CATARACT EXTRACTION PHACO AND INTRAOCULAR LENS PLACEMENT ([place]) RIGHT;  Surgeon: Birder Robson, MD;  Location: [facility];  Service: Ophthalmology;  Laterality: Right;  1:10 16.7% 11.84  . CATARACT EXTRACTION W/PHACO Left 08/13/2019   Procedure: CATARACT EXTRACTION PHACO AND INTRAOCULAR LENS PLACEMENT ([facility]) LEFT 6.26  00:42.6;  Surgeon: Birder Robson, MD;  Location: [facility];  Service: Ophthalmology;  Laterality: Left;  sleep apnea  . HERNIA REPAIR      There were no vitals filed for this visit.   Subjective Assessment - 04/03/20 0825    Subjective  Patient reports pain is better on ulnar side of wrist after resting and not using hammer for supination/pronation exercises and focusing only on AROM.  Pt ready for his upcoming vacation and will be away for 2 weeks.    Pertinent History  02/08/2020 had accident working on fan when wrench was caught in blades resulting in distal R radius fx - ORIF and plastic surgery were done 5/23 - and was in long cast then short cast - and stitches taken out 03/10/20 - and in removal wrist splint now -refer to OT to start hand therapy    Patient Stated Goals want to be albe to use my R hand to use tools, squeeze , lift , carry and pull/push objects    Currently in Pain? No/denies    Pain Score 0-No pain           Patient wearing compression glove at night for edema control, in the am the hand still feels tight.  Decreased pain in wrist after rest from hammer exercises.  Some increased edema this am when mowing the lawn with hand in a more dependent position.  Continuing with contrast at home for edema management.  Fluidotherapy to right hand, wrist and forearm to decrease overall pain, increase motion.    Following fluidotherapy, Patient seen for manual therapy techniques with therapist performing [place] spreads , CT spreads and webspace soft tissue mobs scar massage performed followed by light graston tool nr 2 brushing and sweeping mini massager on scar on wrist and forearm this date. Pt has massager at home he will take with him on vacation to use.    Issued new [employer]  care for scar management to use over the next 2 weeks.  AAROM for MC flexion , intrinsic fist and composite fist to52finger out of palm - pull on volar wrist under scar, cues to keep wrist neutral Composite PROM flexion to digits with target of pain less than 2/10 PROM for wrist over edge of table for wrist flexion , ext, RD, UD -10 repscontinue with 2 times extension than flexion   Issued teal putty for gross grasping patterns, 2 point and 3 point pinch, 1 set of 10 reps.  May increase on vacation every 3-4 days to add a set up to 3 sets if no increased pain.   Re-introduced 16 oz hammer with instructions on hand placement, pt may have been holding hammer at distal end  prior which may have produced pain with the longer lever arm.  Instructions on how to modify while on vacation and based on pain responses.   Instructions on exercises while traveling, pt demos understanding.               OT Treatments/Exercises (OP) - 04/03/20 1258      RUE Fluidotherapy   Number Minutes Fluidotherapy 10 Minutes    RUE Fluidotherapy Location Hand;Wrist    Comments AROM to decrease stiffness                  OT Education - 04/03/20 1255    Education Details updates on HEP and plan while on vacation    Person(s) Educated Patient    Methods Explanation;Demonstration;Tactile cues;Verbal cues;Handout    Comprehension Verbal cues required;Returned demonstration;Verbalized understanding            OT Short Term Goals - 03/12/20 1504      OT SHORT TERM GOAL #1   Title Pt to be independent in HEP to decrease edema and increase AROM in digits flexion to touch palm and thumb to hold tool , plate and cup    Baseline decrease AROM in all digits flexion and thumb - see flowsheet-no knowledge of HEP    Time 3    Period Weeks    Status New    Target Date 04/02/20      OT SHORT TERM GOAL #2   Title R wrist AROM improve with more than 10-40 degrees to turn doorknob and use hand in more than 50% of ADL's    Baseline AROM or R wrist decrease in all motion- see flowsheet- cannot turn doorknob -and 30% use of R hand in bathingand dressing    Time 4    Period Weeks    Status New    Target Date 04/09/20             OT Long Term Goals - 03/12/20 1512      OT LONG TERM GOAL #1   Title Pt to increase R hand grip strenght to more than 50% compare to L hand to grip tools and squeeze washclotth    Baseline NT grip - decrease fisting - only 4 1/2 wsks s/p    Time 8    Period Weeks    Status New    Target Date 05/07/20      OT LONG TERM GOAL #2   Title R wrist AROM improve to [facility] to returning 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     Baseline R wrist AROM decrease in all planes -and using hand only in 30%    Time 8    Period  Weeks    Status New    Target Date 05/07/20      OT LONG TERM GOAL #3   Title Function score on PRWHE improve with more than 30 point    Baseline at eval PRWHE function score is 42.5/50    Time 8    Period Weeks    Status New    Target Date 05/07/20                 Plan - 04/03/20 1257    Clinical Impression Statement Pt is 7 weeks s/p right distal radius fx with ORIF and laceration.  Patient continues to demonstrate increased edema in right hand especially with attempts to use during yardwork.  Pain in ulnar side of the hand decreased and re-introduced hammer exercise this date with no pain.  Patient will be away on vacation for the next 2 weeks and will focus on continued scar management, pain control, ROM, strength and edema control.  Patient demonstrates understanding of exercises and progressions.  Will reassess when pt returns.    OT Occupational Profile and History Problem Focused Assessment - Including review of records relating to presenting problem    Occupational performance deficits (Please refer to evaluation for details): ADL's;IADL's;Work;Play;Leisure    Body Structure / Function / Physical Skills ADL;ROM;UE functional use;Flexibility;FMC;Scar mobility;Dexterity;Edema;Pain;Strength;IADL    Rehab Potential Good    Clinical Decision Making Limited treatment options, no task modification necessary    Comorbidities Affecting Occupational Performance: None    Modification or Assistance to Complete Evaluation  No modification of tasks or assist necessary to complete eval    OT Frequency 2x / week    OT Duration 8 weeks    OT Treatment/Interventions Self-care/ADL training;Splinting;Patient/family education;Paraffin;Fluidtherapy;Contrast Bath;Manual Therapy;Passive range of motion;Scar mobilization;Therapeutic exercise    Plan assess progress with HEP a    Consulted and Agree  with Plan of Care Patient           Patient will benefit from skilled therapeutic intervention in order to improve the following deficits and impairments:   Body Structure / Function / Physical Skills: ADL, ROM, UE functional use, Flexibility, FMC, Scar mobility, Dexterity, Edema, Pain, Strength, IADL       Visit Diagnosis: Scar condition and fibrosis of skin  Stiffness of right hand, not elsewhere classified  Stiffness of right wrist, not elsewhere classified  Muscle weakness (generalized)  Pain in right hand  Localized edema    Problem List Patient Active Problem List   Diagnosis Date Noted  . Benign prostatic hyperplasia with lower urinary tract symptoms 06/18/2018  . History of elevated PSA 06/18/2018  . Atrial fibrillation with RVR ([place]) 09/18/2017  . Chest pain 09/18/2017  . GERD (gastroesophageal reflux disease) 09/18/2017  . OSA (obstructive sleep apnea) 09/18/2017   Janyla Biscoe T Shahla Betsill, OTR/L, CLT  Alonte Wulff 04/03/2020, 1:26 PM  [facility] [address], [place], [address] Phone: [phone]   Fax:  [phone]  Name: Elijah Nelson MRN: [id number] Date of Birth: 03-06-1941

## 2020-04-23 ENCOUNTER — Other Ambulatory Visit: Payer: Self-pay

## 2020-04-23 ENCOUNTER — Ambulatory Visit: Payer: Medicare Other | Attending: Physician Assistant | Admitting: Occupational Therapy

## 2020-04-23 DIAGNOSIS — R600 Localized edema: Secondary | ICD-10-CM

## 2020-04-23 DIAGNOSIS — M25641 Stiffness of right hand, not elsewhere classified: Secondary | ICD-10-CM

## 2020-04-23 DIAGNOSIS — M25631 Stiffness of right wrist, not elsewhere classified: Secondary | ICD-10-CM | POA: Diagnosis present

## 2020-04-23 DIAGNOSIS — M79641 Pain in right hand: Secondary | ICD-10-CM

## 2020-04-23 DIAGNOSIS — L905 Scar conditions and fibrosis of skin: Secondary | ICD-10-CM

## 2020-04-23 DIAGNOSIS — M6281 Muscle weakness (generalized): Secondary | ICD-10-CM | POA: Diagnosis present

## 2020-04-23 NOTE — Therapy (Signed)
[facility] [address], [place], [address] Phone: [phone]   Fax:  [phone]  Occupational Therapy Treatment  Patient Details  Name: Elijah Nelson MRN: [id number] Date of Birth: Sep 19, 1941 Referring Provider (OT): Candace Gallus PA    Encounter Date: 04/23/2020   OT End of Session - 04/23/20 0852    Visit Number 8    Number of Visits 16    Date for OT Re-Evaluation 05/07/20    OT Start Time 0820    OT Stop Time 0914    OT Time Calculation (min) 54 min    Activity Tolerance Patient tolerated treatment well    Behavior During Therapy [facility] for tasks assessed/performed           Past Medical History:  Diagnosis Date  . Arthritis    neck  . Atrial fibrillation ([place])   . BPH (benign prostatic hyperplasia)   . Chronic headache   . GERD (gastroesophageal reflux disease)   . History of skin cancer   . History of skin cancer   . OSA (obstructive sleep apnea)    CPAP    Past Surgical History:  Procedure Laterality Date  . CATARACT EXTRACTION W/PHACO Right 07/23/2019   Procedure: CATARACT EXTRACTION PHACO AND INTRAOCULAR LENS PLACEMENT ([place]) RIGHT;  Surgeon: Birder Robson, MD;  Location: [place];  Service: Ophthalmology;  Laterality: Right;  1:10 16.7% 11.84  . CATARACT EXTRACTION W/PHACO Left 08/13/2019   Procedure: CATARACT EXTRACTION PHACO AND INTRAOCULAR LENS PLACEMENT ([facility]) LEFT 6.26  00:42.6;  Surgeon: Birder Robson, MD;  Location: [place];  Service: Ophthalmology;  Laterality: Left;  sleep apnea  . HERNIA REPAIR      There were no vitals filed for this visit.   Subjective Assessment - 04/23/20 1020    Subjective  I was about 3 wks on vacation - I worked yesterday in my shop and was able to use my hand much more - but still have this pain in my thumb and then scar still tight and bending my wrist back not as good at the other one    Pertinent History  02/08/2020 had accident working on fan when wrench was caught in blades resulting in distal R radius fx - ORIF and plastic surgery were done 5/23 - and was in long cast then short cast - and stitches taken out 03/10/20 - and in removal wrist splint now -refer to OT to start hand therapy    Patient Stated Goals want to be albe to use my R hand to use tools, squeeze , lift , carry and pull/push objects    Currently in Pain? Yes    Pain Score 4     Pain Location Hand    Pain Orientation Right    Pain Type Acute pain;Surgical pain    Pain Onset More than a month ago    Pain Frequency Intermittent              [facility] OT Assessment - 04/23/20 0001      AROM   Right Wrist Extension 42 Degrees    Right Wrist Flexion 82 Degrees    Right Wrist Radial Deviation 20 Degrees    Right Wrist Ulnar Deviation 40 Degrees      Strength   Right Hand Grip (lbs) 42    Right Hand Lateral Pinch 17 lbs    Right Hand 3 Point Pinch 12 lbs    Left Hand Grip (lbs)  99    Left Hand Lateral Pinch 27 lbs    Left Hand 3 Point Pinch 23 lbs      Right Hand AROM   R Index  MCP 0-90 85 Degrees    R Index PIP 0-100 90 Degrees    R Long  MCP 0-90 85 Degrees    R Long PIP 0-100 90 Degrees    R Ring  MCP 0-90 80 Degrees    R Ring PIP 0-100 95 Degrees    R Little  MCP 0-90 80 Degrees    R Little PIP 0-100 95 Degrees          pt made great progress in wrist AROM except wrist extetion  Scar still thick on volar wrist and forearm - limiting his flexibility and composite fist  Still some tightness in digits per pt  And thumb pain on base of thumb with prehension and grip strength  Decrease - see flowsheet Pt fitted with CMC neoprene splint to use with activities that cause pain  Had increase strength with less pain - when reassess  M/M for wrist in all planes - in his range of motion - 5/5            OT Treatments/Exercises (OP) - 04/23/20 0001      RUE Paraffin   Number Minutes Paraffin 10 Minutes     RUE Paraffin Location Wrist    Comments wrist ext, flexion stretches 2 x 2 min each          scar mobs done by OT - and using xtractor with wrist extention AAROm  And mini massager -and graston tool nr 2 for sweeping and brushing over volar hand , wrist and forearm - prior to ROM   AAROM and PROM for wrist extention -and flexion -with focus on extention more - open hand and composite fist  Table slides - 20- reps- on towel roll with thumb off towel to decrease thumb pain  Add to HEP  Increase to 58 degrees for wrist ext         OT Education - 04/23/20 1021    Education Details progress and HEP what to focus on    Person(s) Educated Patient    Methods Explanation;Demonstration;Tactile cues;Verbal cues;Handout    Comprehension Verbal cues required;Returned demonstration;Verbalized understanding            OT Short Term Goals - 03/12/20 1504      OT SHORT TERM GOAL #1   Title Pt to be independent in HEP to decrease edema and increase AROM in digits flexion to touch palm and thumb to hold tool , plate and cup    Baseline decrease AROM in all digits flexion and thumb - see flowsheet-no knowledge of HEP    Time 3    Period Weeks    Status New    Target Date 04/02/20      OT SHORT TERM GOAL #2   Title R wrist AROM improve with more than 10-40 degrees to turn doorknob and use hand in more than 50% of ADL's    Baseline AROM or R wrist decrease in all motion- see flowsheet- cannot turn doorknob -and 30% use of R hand in bathingand dressing    Time 4    Period Weeks    Status New    Target Date 04/09/20             OT Long Term Goals - 03/12/20 1512      OT  LONG TERM GOAL #1   Title Pt to increase R hand grip strenght to more than 50% compare to L hand to grip tools and squeeze washclotth    Baseline NT grip - decrease fisting - only 4 1/2 wsks s/p    Time 8    Period Weeks    Status New    Target Date 05/07/20      OT LONG TERM GOAL #2   Title R wrist AROM improve  to [facility] to returning 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    Baseline R wrist AROM decrease in all planes -and using hand only in 30%    Time 8    Period Weeks    Status New    Target Date 05/07/20      OT LONG TERM GOAL #3   Title Function score on PRWHE improve with more than 30 point    Baseline at eval PRWHE function score is 42.5/50    Time 8    Period Weeks    Status New    Target Date 05/07/20                 Plan - 04/23/20 1022    Clinical Impression Statement Pt is about 10 1/2 wks s/p R distal radius fx and laceration - pt show increase strength in his AROM wrist - but wrist extention same as 3 wks ago at 42 degrees. Scar tissue still adhere and limiting pt's compostie fist and wrist extention- show decrease grip and prehension strength - but limited by some thumb pain at base of thumb - focus on scar tissue , wrist ext , flexion -and increase grip and prehension strength    OT Occupational Profile and History Problem Focused Assessment - Including review of records relating to presenting problem    Occupational performance deficits (Please refer to evaluation for details): ADL's;IADL's;Work;Play;Leisure    Body Structure / Function / Physical Skills ADL;ROM;UE functional use;Flexibility;FMC;Scar mobility;Dexterity;Edema;Pain;Strength;IADL    Rehab Potential Good    Clinical Decision Making Limited treatment options, no task modification necessary    Comorbidities Affecting Occupational Performance: None    Modification or Assistance to Complete Evaluation  No modification of tasks or assist necessary to complete eval    OT Frequency 2x / week    OT Duration 2 weeks    OT Treatment/Interventions Self-care/ADL training;Splinting;Patient/family education;Paraffin;Fluidtherapy;Contrast Bath;Manual Therapy;Passive range of motion;Scar mobilization;Therapeutic exercise    Plan scar tissue, wrist ext, flexion -and grip /prehension - decrease thumb  pain    Consulted and Agree with Plan of Care Patient           Patient will benefit from skilled therapeutic intervention in order to improve the following deficits and impairments:   Body Structure / Function / Physical Skills: ADL, ROM, UE functional use, Flexibility, FMC, Scar mobility, Dexterity, Edema, Pain, Strength, IADL       Visit Diagnosis: Scar condition and fibrosis of skin  Stiffness of right hand, not elsewhere classified  Stiffness of right wrist, not elsewhere classified  Muscle weakness (generalized)  Pain in right hand  Localized edema    Problem List Patient Active Problem List   Diagnosis Date Noted  . Benign prostatic hyperplasia with lower urinary tract symptoms 06/18/2018  . History of elevated PSA 06/18/2018  . Atrial fibrillation with RVR ([place]) 09/18/2017  . Chest pain 09/18/2017  . GERD (gastroesophageal reflux disease) 09/18/2017  . OSA (obstructive sleep apnea) 09/18/2017  Rosalyn Gess OTR/L,CLT 04/23/2020, 10:27 AM  [facility] [address] [place], [address] Phone: [phone]   Fax:  [phone]  Name: JAXSTON CHOHAN MRN: [id number] Date of Birth: 30-Mar-1941

## 2020-04-27 ENCOUNTER — Ambulatory Visit: Payer: Medicare Other | Admitting: Occupational Therapy

## 2020-04-27 ENCOUNTER — Other Ambulatory Visit: Payer: Self-pay

## 2020-04-27 DIAGNOSIS — L905 Scar conditions and fibrosis of skin: Secondary | ICD-10-CM | POA: Diagnosis not present

## 2020-04-27 DIAGNOSIS — M25631 Stiffness of right wrist, not elsewhere classified: Secondary | ICD-10-CM

## 2020-04-27 DIAGNOSIS — M6281 Muscle weakness (generalized): Secondary | ICD-10-CM

## 2020-04-27 DIAGNOSIS — M25641 Stiffness of right hand, not elsewhere classified: Secondary | ICD-10-CM

## 2020-04-27 DIAGNOSIS — M79641 Pain in right hand: Secondary | ICD-10-CM

## 2020-04-27 DIAGNOSIS — R600 Localized edema: Secondary | ICD-10-CM

## 2020-04-27 NOTE — Therapy (Signed)
[facility] [address], [place], [address] Phone: [phone]   Fax:  [phone]  Occupational Therapy Treatment  Patient Details  Name: Elijah Nelson MRN: [id number] Date of Birth: 1941-03-19 Referring Provider (OT): Candace Gallus PA    Encounter Date: 04/27/2020   OT End of Session - 04/27/20 0839    Visit Number 9    Number of Visits 16    Date for OT Re-Evaluation 05/07/20    OT Start Time 0820    OT Stop Time 0901    OT Time Calculation (min) 41 min    Activity Tolerance Patient tolerated treatment well    Behavior During Therapy [facility] for tasks assessed/performed           Past Medical History:  Diagnosis Date  . Arthritis    neck  . Atrial fibrillation ([place])   . BPH (benign prostatic hyperplasia)   . Chronic headache   . GERD (gastroesophageal reflux disease)   . History of skin cancer   . History of skin cancer   . OSA (obstructive sleep apnea)    CPAP    Past Surgical History:  Procedure Laterality Date  . CATARACT EXTRACTION W/PHACO Right 07/23/2019   Procedure: CATARACT EXTRACTION PHACO AND INTRAOCULAR LENS PLACEMENT ([place]) RIGHT;  Surgeon: Birder Robson, MD;  Location: [place];  Service: Ophthalmology;  Laterality: Right;  1:10 16.7% 11.84  . CATARACT EXTRACTION W/PHACO Left 08/13/2019   Procedure: CATARACT EXTRACTION PHACO AND INTRAOCULAR LENS PLACEMENT ([facility]) LEFT 6.26  00:42.6;  Surgeon: Birder Robson, MD;  Location: [place];  Service: Ophthalmology;  Laterality: Left;  sleep apnea  . HERNIA REPAIR      There were no vitals filed for this visit.   Subjective Assessment - 04/27/20 0832    Subjective  I drove up to [place] to get glass oven - so I grip a lot the steering wheel - but I can tell it is little etter bending back    Pertinent History 02/08/2020 had accident working on fan when wrench was caught in blades resulting in distal R radius fx -  ORIF and plastic surgery were done 5/23 - and was in long cast then short cast - and stitches taken out 03/10/20 - and in removal wrist splint now -refer to OT to start hand therapy    Patient Stated Goals want to be albe to use my R hand to use tools, squeeze , lift , carry and pull/push objects    Currently in Pain? No/denies              [facility] OT Assessment - 04/27/20 0001      AROM   Right Wrist Extension 49 Degrees   60 in session   Right Wrist Flexion 82 Degrees    Right Wrist Radial Deviation 20 Degrees    Right Wrist Ulnar Deviation 40 Degrees                    OT Treatments/Exercises (OP) - 04/27/20 0001      RUE Paraffin   Number Minutes Paraffin 10 Minutes    RUE Paraffin Location Wrist    Comments wrist extention stretch and prior to scar tissue             scar mobs done by OT - and using xtractor with wrist extention AAROm - pt to do scar mobs using piece of coban for more traction on scar  And mini massager -and graston tool nr 2 for sweeping and brushing over volar hand , wrist and forearm - prior to ROM   AAROM and PROM for wrist extention -and flexion -with focus on extention more - open hand and composite fist  Table slides - 20- reps- on towel roll with thumb off towel to decrease thumb pain  Add to HEP last time  Increase to 62 degrees for wrist ext in session  Tendon glides - address with AAROM each -some edema and tightness over volar plate/MC still          OT Education - 04/27/20 0837    Education Details focus on wrist ext, scar tissue - and composite fist    Person(s) Educated Patient    Methods Explanation;Demonstration;Tactile cues;Verbal cues;Handout    Comprehension Verbal cues required;Returned demonstration;Verbalized understanding            OT Short Term Goals - 03/12/20 1504      OT SHORT TERM GOAL #1   Title Pt to be independent in HEP to decrease edema and increase AROM in digits flexion to touch palm and  thumb to hold tool , plate and cup    Baseline decrease AROM in all digits flexion and thumb - see flowsheet-no knowledge of HEP    Time 3    Period Weeks    Status New    Target Date 04/02/20      OT SHORT TERM GOAL #2   Title R wrist AROM improve with more than 10-40 degrees to turn doorknob and use hand in more than 50% of ADL's    Baseline AROM or R wrist decrease in all motion- see flowsheet- cannot turn doorknob -and 30% use of R hand in bathingand dressing    Time 4    Period Weeks    Status New    Target Date 04/09/20             OT Long Term Goals - 03/12/20 1512      OT LONG TERM GOAL #1   Title Pt to increase R hand grip strenght to more than 50% compare to L hand to grip tools and squeeze washclotth    Baseline NT grip - decrease fisting - only 4 1/2 wsks s/p    Time 8    Period Weeks    Status New    Target Date 05/07/20      OT LONG TERM GOAL #2   Title R wrist AROM improve to [facility] to returning 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    Baseline R wrist AROM decrease in all planes -and using hand only in 30%    Time 8    Period Weeks    Status New    Target Date 05/07/20      OT LONG TERM GOAL #3   Title Function score on PRWHE improve with more than 30 point    Baseline at eval PRWHE function score is 42.5/50    Time 8    Period Weeks    Status New    Target Date 05/07/20                 Plan - 04/27/20 0840    Clinical Impression Statement Pt is 11 wks s/p R distal radius fx with laceration - pt cont to show decrease wrist extention , scar adhesion limiting wrist extention and composite grip - pt can benefit from few more session to  adress these limitations    OT Occupational Profile and History Problem Focused Assessment - Including review of records relating to presenting problem    Occupational performance deficits (Please refer to evaluation for details): ADL's;IADL's;Work;Play;Leisure    Body Structure / Function /  Physical Skills ADL;ROM;UE functional use;Flexibility;FMC;Scar mobility;Dexterity;Edema;Pain;Strength;IADL    Rehab Potential Good    Clinical Decision Making Limited treatment options, no task modification necessary    Comorbidities Affecting Occupational Performance: None    Modification or Assistance to Complete Evaluation  No modification of tasks or assist necessary to complete eval    OT Frequency 2x / week    OT Duration 2 weeks    OT Treatment/Interventions Self-care/ADL training;Splinting;Patient/family education;Paraffin;Fluidtherapy;Contrast Bath;Manual Therapy;Passive range of motion;Scar mobilization;Therapeutic exercise    Plan scar tissue, wrist ext, flexion -and grip /prehension - decrease thumb pain    Consulted and Agree with Plan of Care Patient           Patient will benefit from skilled therapeutic intervention in order to improve the following deficits and impairments:   Body Structure / Function / Physical Skills: ADL, ROM, UE functional use, Flexibility, FMC, Scar mobility, Dexterity, Edema, Pain, Strength, IADL       Visit Diagnosis: Scar condition and fibrosis of skin  Stiffness of right hand, not elsewhere classified  Stiffness of right wrist, not elsewhere classified  Muscle weakness (generalized)  Pain in right hand  Localized edema    Problem List Patient Active Problem List   Diagnosis Date Noted  . Benign prostatic hyperplasia with lower urinary tract symptoms 06/18/2018  . History of elevated PSA 06/18/2018  . Atrial fibrillation with RVR ([place]) 09/18/2017  . Chest pain 09/18/2017  . GERD (gastroesophageal reflux disease) 09/18/2017  . OSA (obstructive sleep apnea) 09/18/2017    Rosalyn Gess OTR/L,CLT 04/27/2020, 9:40 AM  [facility] [address] [place], [address] Phone: [phone]   Fax:  [phone]  Name: Elijah Nelson MRN: [id number] Date of  Birth: Oct 13, 1940

## 2020-04-30 ENCOUNTER — Other Ambulatory Visit: Payer: Self-pay

## 2020-04-30 ENCOUNTER — Ambulatory Visit: Payer: Medicare Other | Admitting: Occupational Therapy

## 2020-04-30 DIAGNOSIS — M6281 Muscle weakness (generalized): Secondary | ICD-10-CM

## 2020-04-30 DIAGNOSIS — L905 Scar conditions and fibrosis of skin: Secondary | ICD-10-CM | POA: Diagnosis not present

## 2020-04-30 DIAGNOSIS — M79641 Pain in right hand: Secondary | ICD-10-CM

## 2020-04-30 DIAGNOSIS — M25631 Stiffness of right wrist, not elsewhere classified: Secondary | ICD-10-CM

## 2020-04-30 DIAGNOSIS — R600 Localized edema: Secondary | ICD-10-CM

## 2020-04-30 DIAGNOSIS — M25641 Stiffness of right hand, not elsewhere classified: Secondary | ICD-10-CM

## 2020-04-30 NOTE — Therapy (Signed)
[facility] [address] [place], [address] Phone: [phone]   Fax:  [phone]  Occupational Therapy Treatment  Patient Details  Name: Elijah Nelson MRN: [id number] Date of Birth: 10-Mar-1941 Referring Provider (OT): Candace Gallus PA    Encounter Date: 04/30/2020   OT End of Session - 04/30/20 0927    Visit Number 10    Number of Visits 16    Date for OT Re-Evaluation 05/07/20    OT Start Time 0910    OT Stop Time 0950    OT Time Calculation (min) 40 min    Activity Tolerance Patient tolerated treatment well    Behavior During Therapy [facility] for tasks assessed/performed           Past Medical History:  Diagnosis Date  . Arthritis    neck  . Atrial fibrillation ([place])   . BPH (benign prostatic hyperplasia)   . Chronic headache   . GERD (gastroesophageal reflux disease)   . History of skin cancer   . History of skin cancer   . OSA (obstructive sleep apnea)    CPAP    Past Surgical History:  Procedure Laterality Date  . CATARACT EXTRACTION W/PHACO Right 07/23/2019   Procedure: CATARACT EXTRACTION PHACO AND INTRAOCULAR LENS PLACEMENT ([place]) RIGHT;  Surgeon: Birder Robson, MD;  Location: [place];  Service: Ophthalmology;  Laterality: Right;  1:10 16.7% 11.84  . CATARACT EXTRACTION W/PHACO Left 08/13/2019   Procedure: CATARACT EXTRACTION PHACO AND INTRAOCULAR LENS PLACEMENT ([facility]) LEFT 6.26  00:42.6;  Surgeon: Birder Robson, MD;  Location: [place];  Service: Ophthalmology;  Laterality: Left;  sleep apnea  . HERNIA REPAIR      There were no vitals filed for this visit.   Subjective Assessment - 04/30/20 0924    Subjective  I seen the Dr yesterday - up to 15lbs - and follow up in 3 months - did not do my exercises this am yet    Pertinent History 02/08/2020 had accident working on fan when wrench was caught in blades resulting in distal R radius fx - ORIF and  plastic surgery were done 5/23 - and was in long cast then short cast - and stitches taken out 03/10/20 - and in removal wrist splint now -refer to OT to start hand therapy    Patient Stated Goals want to be albe to use my R hand to use tools, squeeze , lift , carry and pull/push objects    Currently in Pain? No/denies              [facility] OT Assessment - 04/30/20 0001      AROM   Right Wrist Extension 51 Degrees    Right Wrist Flexion 80 Degrees            cont to show increase wrist extention -  Strength in AROM at R wrist 5/5          OT Treatments/Exercises (OP) - 04/30/20 0001      RUE Paraffin   Number Minutes Paraffin 8 Minutes    RUE Paraffin Location Wrist    Comments prior to scar massage and wrist extention stretch             scar mobs done by OT - and using xtractor with wrist extention AAROm - pt to do scar mobs using piece of coban for more traction on scar  And mini massager -and graston tool nr 2 for  sweeping and brushing over volar hand , wrist and forearm - prior to ROM   AAROM and PROM for wrist extention -and flexion -with focus on extention more - open hand and composite fist  Table slides - 20- reps- on towel roll with thumb off towel to decrease thumb pain  2 lbs weight for wrist extention 2 x 12 reps Increase to 62 degrees for wrist extin session  Tendon glides - address with AAROM each -some edema and tightness over volar plate/MC still  Hand gripper add to HEP -2 x 12 reps at 15 lbs - gripping and sustained grip hold 5 sec          OT Education - 04/30/20 0926    Education Details focus on wrist ext, scar tissue - and composite fist    Person(s) Educated Patient    Methods Explanation;Demonstration;Tactile cues;Verbal cues;Handout    Comprehension Verbal cues required;Returned demonstration;Verbalized understanding            OT Short Term Goals - 03/12/20 1504      OT SHORT TERM GOAL #1   Title Pt to be independent in HEP  to decrease edema and increase AROM in digits flexion to touch palm and thumb to hold tool , plate and cup    Baseline decrease AROM in all digits flexion and thumb - see flowsheet-no knowledge of HEP    Time 3    Period Weeks    Status New    Target Date 04/02/20      OT SHORT TERM GOAL #2   Title R wrist AROM improve with more than 10-40 degrees to turn doorknob and use hand in more than 50% of ADL's    Baseline AROM or R wrist decrease in all motion- see flowsheet- cannot turn doorknob -and 30% use of R hand in bathingand dressing    Time 4    Period Weeks    Status New    Target Date 04/09/20             OT Long Term Goals - 03/12/20 1512      OT LONG TERM GOAL #1   Title Pt to increase R hand grip strenght to more than 50% compare to L hand to grip tools and squeeze washclotth    Baseline NT grip - decrease fisting - only 4 1/2 wsks s/p    Time 8    Period Weeks    Status New    Target Date 05/07/20      OT LONG TERM GOAL #2   Title R wrist AROM improve to [facility] to returning 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    Baseline R wrist AROM decrease in all planes -and using hand only in 30%    Time 8    Period Weeks    Status New    Target Date 05/07/20      OT LONG TERM GOAL #3   Title Function score on PRWHE improve with more than 30 point    Baseline at eval PRWHE function score is 42.5/50    Time 8    Period Weeks    Status New    Target Date 05/07/20                 Plan - 04/30/20 0927    Clinical Impression Statement Pt is 11 1/2 wks s/p R distal radius fx with laceration - pt cont to show increase wrist extention - increase  strength at wrist and functional use - Followed up yesterday with surgeon and healing good- increase weight limit to 15 lbs - cont scar mobility and wrist extention , grip strengthening    OT Occupational Profile and History Problem Focused Assessment - Including review of records relating to presenting  problem    Occupational performance deficits (Please refer to evaluation for details): ADL's;IADL's;Work;Play;Leisure    Body Structure / Function / Physical Skills ADL;ROM;UE functional use;Flexibility;FMC;Scar mobility;Dexterity;Edema;Pain;Strength;IADL    Rehab Potential Good    Clinical Decision Making Limited treatment options, no task modification necessary    Comorbidities Affecting Occupational Performance: None    Modification or Assistance to Complete Evaluation  No modification of tasks or assist necessary to complete eval    OT Frequency 2x / week    OT Duration 2 weeks    OT Treatment/Interventions Self-care/ADL training;Splinting;Patient/family education;Paraffin;Fluidtherapy;Contrast Bath;Manual Therapy;Passive range of motion;Scar mobilization;Therapeutic exercise    Plan scar tissue, wrist ext, flexion -and grip /prehension - decrease thumb pain    Consulted and Agree with Plan of Care Patient           Patient will benefit from skilled therapeutic intervention in order to improve the following deficits and impairments:   Body Structure / Function / Physical Skills: ADL, ROM, UE functional use, Flexibility, FMC, Scar mobility, Dexterity, Edema, Pain, Strength, IADL       Visit Diagnosis: Scar condition and fibrosis of skin  Stiffness of right hand, not elsewhere classified  Stiffness of right wrist, not elsewhere classified  Muscle weakness (generalized)  Pain in right hand  Localized edema    Problem List Patient Active Problem List   Diagnosis Date Noted  . Benign prostatic hyperplasia with lower urinary tract symptoms 06/18/2018  . History of elevated PSA 06/18/2018  . Atrial fibrillation with RVR ([place]) 09/18/2017  . Chest pain 09/18/2017  . GERD (gastroesophageal reflux disease) 09/18/2017  . OSA (obstructive sleep apnea) 09/18/2017    Rosalyn Gess OTR/L,CLT 04/30/2020, 12:00 PM  [facility] [address], [place], [address] Phone: [phone]   Fax:  [phone]  Name: Elijah Nelson MRN: [id number] Date of Birth: 10/06/1940

## 2020-05-04 ENCOUNTER — Ambulatory Visit: Payer: Medicare Other | Admitting: Occupational Therapy

## 2020-05-05 ENCOUNTER — Ambulatory Visit: Payer: Medicare Other | Admitting: Occupational Therapy

## 2020-05-05 ENCOUNTER — Other Ambulatory Visit: Payer: Self-pay

## 2020-05-05 DIAGNOSIS — R600 Localized edema: Secondary | ICD-10-CM

## 2020-05-05 DIAGNOSIS — M79641 Pain in right hand: Secondary | ICD-10-CM

## 2020-05-05 DIAGNOSIS — M6281 Muscle weakness (generalized): Secondary | ICD-10-CM

## 2020-05-05 DIAGNOSIS — M25641 Stiffness of right hand, not elsewhere classified: Secondary | ICD-10-CM

## 2020-05-05 DIAGNOSIS — L905 Scar conditions and fibrosis of skin: Secondary | ICD-10-CM | POA: Diagnosis not present

## 2020-05-05 DIAGNOSIS — M25631 Stiffness of right wrist, not elsewhere classified: Secondary | ICD-10-CM

## 2020-05-05 NOTE — Therapy (Signed)
[facility] [address], [place], [address] Phone: [phone]   Fax:  [phone]  Occupational Therapy Treatment  Patient Details  Name: Elijah Nelson MRN: [id number] Date of Birth: 17-Mar-1941 Referring Provider (OT): Candace Gallus PA    Encounter Date: 05/05/2020   OT End of Session - 05/05/20 0927    Visit Number 11    Number of Visits 16    Date for OT Re-Evaluation 05/07/20    OT Start Time 0816    OT Stop Time 0910    OT Time Calculation (min) 54 min    Activity Tolerance Patient tolerated treatment well    Behavior During Therapy [facility] for tasks assessed/performed           Past Medical History:  Diagnosis Date  . Arthritis    neck  . Atrial fibrillation ([place])   . BPH (benign prostatic hyperplasia)   . Chronic headache   . GERD (gastroesophageal reflux disease)   . History of skin cancer   . History of skin cancer   . OSA (obstructive sleep apnea)    CPAP    Past Surgical History:  Procedure Laterality Date  . CATARACT EXTRACTION W/PHACO Right 07/23/2019   Procedure: CATARACT EXTRACTION PHACO AND INTRAOCULAR LENS PLACEMENT ([place]) RIGHT;  Surgeon: Birder Robson, MD;  Location: [place];  Service: Ophthalmology;  Laterality: Right;  1:10 16.7% 11.84  . CATARACT EXTRACTION W/PHACO Left 08/13/2019   Procedure: CATARACT EXTRACTION PHACO AND INTRAOCULAR LENS PLACEMENT ([facility]) LEFT 6.26  00:42.6;  Surgeon: Birder Robson, MD;  Location: [place];  Service: Ophthalmology;  Laterality: Left;  sleep apnea  . HERNIA REPAIR      There were no vitals filed for this visit.   Subjective Assessment - 05/05/20 0922    Subjective  Using my hand more since we come back from vacation -fingers feel stiff - I think I have some arthritis but other wise doing better- I have more strength    Pertinent History 02/08/2020 had accident working on fan when wrench was caught in blades  resulting in distal R radius fx - ORIF and plastic surgery were done 5/23 - and was in long cast then short cast - and stitches taken out 03/10/20 - and in removal wrist splint now -refer to OT to start hand therapy    Patient Stated Goals want to be albe to use my R hand to use tools, squeeze , lift , carry and pull/push objects    Currently in Pain? Yes    Pain Score 3     Pain Location --   thumb   Pain Orientation Right    Pain Descriptors / Indicators Aching    Pain Type Chronic pain              [facility] OT Assessment - 05/05/20 0001      AROM   Right Wrist Extension 54 Degrees    Right Wrist Flexion 80 Degrees      Strength   Right Hand Grip (lbs) 50    Right Hand Lateral Pinch 18.5 lbs    Right Hand 3 Point Pinch 14 lbs    Left Hand Grip (lbs) 99    Left Hand Lateral Pinch 27 lbs    Left Hand 3 Point Pinch 23 lbs                    OT Treatments/Exercises (OP) - 05/05/20 0001  RUE Paraffin   Number Minutes Paraffin 8 Minutes    RUE Paraffin Location Wrist    Comments prior to scar tissue and ROM              scar mobs done by OT - and using xtractor with wrist extention AAROm- pt to do scar mobs using piece of coban for more traction on scar And mini massager -and graston tool nr 2 for sweeping and brushing over volar hand , wrist and forearm - prior to ROM   AAROM and PROM for wrist extention -and flexion -with focus on extention more - open hand and composite fist  Table slides - 20- reps- on towel roll with thumb off towel to decrease thumb pain  2 lbs weight for wrist extention 2 x 12 reps Tendon glides - address with AAROM each -some edema and tightness over volar plate/MC still Hand gripper  Review with pt - increase to 25 lbs -2 x 12 reps  - gripping and sustained grip hold 5 sec  pain free  And dark blue firm putty for lat and 3 point grip - 2 x 12 -15 reps  pain free         OT Education - 05/05/20 0926    Education Details  focus on wrist ext, scar tissue - and composite fist    Person(s) Educated Patient    Methods Explanation;Demonstration;Tactile cues;Verbal cues;Handout    Comprehension Verbal cues required;Returned demonstration;Verbalized understanding            OT Short Term Goals - 03/12/20 1504      OT SHORT TERM GOAL #1   Title Pt to be independent in HEP to decrease edema and increase AROM in digits flexion to touch palm and thumb to hold tool , plate and cup    Baseline decrease AROM in all digits flexion and thumb - see flowsheet-no knowledge of HEP    Time 3    Period Weeks    Status New    Target Date 04/02/20      OT SHORT TERM GOAL #2   Title R wrist AROM improve with more than 10-40 degrees to turn doorknob and use hand in more than 50% of ADL's    Baseline AROM or R wrist decrease in all motion- see flowsheet- cannot turn doorknob -and 30% use of R hand in bathingand dressing    Time 4    Period Weeks    Status New    Target Date 04/09/20             OT Long Term Goals - 03/12/20 1512      OT LONG TERM GOAL #1   Title Pt to increase R hand grip strenght to more than 50% compare to L hand to grip tools and squeeze washclotth    Baseline NT grip - decrease fisting - only 4 1/2 wsks s/p    Time 8    Period Weeks    Status New    Target Date 05/07/20      OT LONG TERM GOAL #2   Title R wrist AROM improve to [facility] to returning 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    Baseline R wrist AROM decrease in all planes -and using hand only in 30%    Time 8    Period Weeks    Status New    Target Date 05/07/20      OT LONG TERM GOAL #3  Title Function score on PRWHE improve with more than 30 point    Baseline at eval PRWHE function score is 42.5/50    Time 8    Period Weeks    Status New    Target Date 05/07/20                 Plan - 05/05/20 0934    Clinical Impression Statement Pt is 12 wks s/p R distal radius fx with laceration - pt  making progress in grip and prehension strength as well as scar tissue with increase wrist extention - cont to focus on scar tissue, wrist extention and grip/prehension strength    OT Occupational Profile and History Problem Focused Assessment - Including review of records relating to presenting problem    Occupational performance deficits (Please refer to evaluation for details): ADL's;IADL's;Work;Play;Leisure    Body Structure / Function / Physical Skills ADL;ROM;UE functional use;Flexibility;FMC;Scar mobility;Dexterity;Edema;Pain;Strength;IADL    Rehab Potential Good    Clinical Decision Making Limited treatment options, no task modification necessary    Comorbidities Affecting Occupational Performance: None    Modification or Assistance to Complete Evaluation  No modification of tasks or assist necessary to complete eval    OT Frequency 2x / week    OT Duration 2 weeks    OT Treatment/Interventions Self-care/ADL training;Splinting;Patient/family education;Paraffin;Fluidtherapy;Contrast Bath;Manual Therapy;Passive range of motion;Scar mobilization;Therapeutic exercise    Plan scar tissue, wrist ext, flexion -and grip /prehension - decrease thumb pain    Consulted and Agree with Plan of Care Patient           Patient will benefit from skilled therapeutic intervention in order to improve the following deficits and impairments:   Body Structure / Function / Physical Skills: ADL, ROM, UE functional use, Flexibility, FMC, Scar mobility, Dexterity, Edema, Pain, Strength, IADL       Visit Diagnosis: Scar condition and fibrosis of skin  Stiffness of right hand, not elsewhere classified  Stiffness of right wrist, not elsewhere classified  Muscle weakness (generalized)  Pain in right hand  Localized edema    Problem List Patient Active Problem List   Diagnosis Date Noted  . Benign prostatic hyperplasia with lower urinary tract symptoms 06/18/2018  . History of elevated PSA  06/18/2018  . Atrial fibrillation with RVR ([place]) 09/18/2017  . Chest pain 09/18/2017  . GERD (gastroesophageal reflux disease) 09/18/2017  . OSA (obstructive sleep apnea) 09/18/2017    Rosalyn Gess OTR/L,CLT 05/05/2020, 9:37 AM  [facility] [address] [place], [address] Phone: [phone]   Fax:  [phone]  Name: Elijah Nelson MRN: [id number] Date of Birth: May 08, 1941

## 2020-05-07 ENCOUNTER — Ambulatory Visit: Payer: Medicare Other | Admitting: Occupational Therapy

## 2020-05-07 ENCOUNTER — Other Ambulatory Visit: Payer: Self-pay

## 2020-05-07 DIAGNOSIS — M6281 Muscle weakness (generalized): Secondary | ICD-10-CM

## 2020-05-07 DIAGNOSIS — M79641 Pain in right hand: Secondary | ICD-10-CM

## 2020-05-07 DIAGNOSIS — R600 Localized edema: Secondary | ICD-10-CM

## 2020-05-07 DIAGNOSIS — L905 Scar conditions and fibrosis of skin: Secondary | ICD-10-CM | POA: Diagnosis not present

## 2020-05-07 DIAGNOSIS — M25631 Stiffness of right wrist, not elsewhere classified: Secondary | ICD-10-CM

## 2020-05-07 DIAGNOSIS — M25641 Stiffness of right hand, not elsewhere classified: Secondary | ICD-10-CM

## 2020-05-07 NOTE — Therapy (Signed)
[facility] [address], [place], [address] Phone: [phone]   Fax:  [phone]  Occupational Therapy Treatment  Patient Details  Name: Elijah Nelson MRN: [id number] Date of Birth: Apr 17, 1941 Referring Provider (OT): Candace Gallus PA    Encounter Date: 05/07/2020   OT End of Session - 05/07/20 0815    Visit Number 12    Number of Visits 16    Date for OT Re-Evaluation 05/07/20    OT Start Time 0817    OT Stop Time 0853    OT Time Calculation (min) 36 min    Activity Tolerance Patient tolerated treatment well    Behavior During Therapy [facility] for tasks assessed/performed           Past Medical History:  Diagnosis Date  . Arthritis    neck  . Atrial fibrillation ([place])   . BPH (benign prostatic hyperplasia)   . Chronic headache   . GERD (gastroesophageal reflux disease)   . History of skin cancer   . History of skin cancer   . OSA (obstructive sleep apnea)    CPAP    Past Surgical History:  Procedure Laterality Date  . CATARACT EXTRACTION W/PHACO Right 07/23/2019   Procedure: CATARACT EXTRACTION PHACO AND INTRAOCULAR LENS PLACEMENT ([place]) RIGHT;  Surgeon: Birder Robson, MD;  Location: [place];  Service: Ophthalmology;  Laterality: Right;  1:10 16.7% 11.84  . CATARACT EXTRACTION W/PHACO Left 08/13/2019   Procedure: CATARACT EXTRACTION PHACO AND INTRAOCULAR LENS PLACEMENT ([facility]) LEFT 6.26  00:42.6;  Surgeon: Birder Robson, MD;  Location: [place];  Service: Ophthalmology;  Laterality: Left;  sleep apnea  . HERNIA REPAIR      There were no vitals filed for this visit.   Subjective Assessment - 05/07/20 0853    Subjective  Doing okay - using it in most everything - at times feels shooting pain at base of thumb - but that was there before - scar looking better    Pertinent History 02/08/2020 had accident working on fan when wrench was caught in blades resulting in  distal R radius fx - ORIF and plastic surgery were done 5/23 - and was in long cast then short cast - and stitches taken out 03/10/20 - and in removal wrist splint now -refer to OT to start hand therapy    Patient Stated Goals want to be albe to use my R hand to use tools, squeeze , lift , carry and pull/push objects    Currently in Pain? Yes    Pain Location --   THumb CMC   Pain Orientation Right    Pain Descriptors / Indicators Stabbing    Pain Type Chronic pain    Pain Onset More than a month ago    Pain Frequency Occasional             simulate PRWHE function activities - score this date 2/50 , at eval 42/50  Pain score 2/50 on PRWHE  Made great progress- using it more - still pull with push up from chair , or pinching tools or knobs - but had some prior to fall at thumb [facility]           OT Treatments/Exercises (OP) - 05/07/20 0001      RUE Paraffin   Number Minutes Paraffin 8 Minutes    RUE Paraffin Location Wrist    Comments prior to scar tissue mobs - and wrist extention stretch 3 x  2 min            scar mobs done by OT - and using xtractor with wrist extention AAROm- pt to do scar mobs using piece of coban for more traction on scar And mini massager -and graston tool nr 2 for sweeping and brushing over volar hand , wrist and forearm - prior to ROM   AAROM and PROM for wrist extention -and flexion -with focus on extention more - open hand and composite fist  Table slides - 20- reps- on towel roll with thumb off towel to decrease thumb pain Tendon glides - address with AAROM each -some edema and tightness over volar plate/MC still Hand gripper  Review with pt last time and to cont with - increase to 25 lbs -2 x 12 reps  - gripping and sustained grip hold 5 sec pain free  And dark blue firm putty for lat and 3 point grip - 2 x 12 -15 reps  pain free  Add wall pushup this date - tolerated well          OT Education - 05/07/20 0815    Education Details  focus on wrist ext, scar tissue - and composite fist    Person(s) Educated Patient    Methods Explanation;Demonstration;Tactile cues;Verbal cues;Handout    Comprehension Verbal cues required;Returned demonstration;Verbalized understanding            OT Short Term Goals - 05/07/20 0856      OT SHORT TERM GOAL #1   Title Pt to be independent in HEP to decrease edema and increase AROM in digits flexion to touch palm and thumb to hold tool , plate and cup    Status Achieved      OT SHORT TERM GOAL #2   Title R wrist AROM improve with more than 10-40 degrees to turn doorknob and use hand in more than 50% of ADL's    Status Achieved             OT Long Term Goals - 05/07/20 0857      OT LONG TERM GOAL #1   Title Pt to increase R hand grip strenght to more than 50% compare to L hand to grip tools and squeeze washclotth    Baseline grip strength R 50 ,L 99 lbs,- gripping tools increasing - progressing - but still not able to make tight composite fist- edema over MC's    Time 7    Period Weeks    Status On-going    Target Date 06/25/20      OT LONG TERM GOAL #2   Title R wrist AROM improve to [facility] to returning 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    Status Achieved      OT LONG TERM GOAL #3   Title Function score on PRWHE improve with more than 30 point    Baseline at eval PRWHE function score is 42.5/50 and now 2/50    Status Achieved                 Plan - 05/07/20 0816    Clinical Impression Statement Pt is about 12 1/2 wks s/p R distal radius fx with laceration - made great progress from [facility] in ROM ,strength and functional use - pt to focus on wrist extention , scar tissue , composite fist and grip/prehension strength    OT Occupational Profile and History Problem Focused Assessment - Including review of records relating to presenting  problem    Occupational performance deficits (Please refer to evaluation for details):  ADL's;IADL's;Work;Play;Leisure    Body Structure / Function / Physical Skills ADL;ROM;UE functional use;Flexibility;FMC;Scar mobility;Dexterity;Edema;Pain;Strength;IADL    Rehab Potential Good    Clinical Decision Making Limited treatment options, no task modification necessary    Comorbidities Affecting Occupational Performance: None    Modification or Assistance to Complete Evaluation  No modification of tasks or assist necessary to complete eval    OT Frequency --   1 x wk for 1 wks, biweekly for 2 wks , then monthly   OT Duration 4 weeks   7 wks   OT Treatment/Interventions Self-care/ADL training;Splinting;Patient/family education;Paraffin;Fluidtherapy;Contrast Bath;Manual Therapy;Passive range of motion;Scar mobilization;Therapeutic exercise    Plan scar tissue, wrist ext, flexion -and grip /prehension - decrease thumb pain    Consulted and Agree with Plan of Care Patient           Patient will benefit from skilled therapeutic intervention in order to improve the following deficits and impairments:   Body Structure / Function / Physical Skills: ADL, ROM, UE functional use, Flexibility, FMC, Scar mobility, Dexterity, Edema, Pain, Strength, IADL       Visit Diagnosis: Scar condition and fibrosis of skin  Stiffness of right hand, not elsewhere classified  Stiffness of right wrist, not elsewhere classified  Muscle weakness (generalized)  Pain in right hand  Localized edema    Problem List Patient Active Problem List   Diagnosis Date Noted  . Benign prostatic hyperplasia with lower urinary tract symptoms 06/18/2018  . History of elevated PSA 06/18/2018  . Atrial fibrillation with RVR ([place]) 09/18/2017  . Chest pain 09/18/2017  . GERD (gastroesophageal reflux disease) 09/18/2017  . OSA (obstructive sleep apnea) 09/18/2017    Rosalyn Gess OTR/L,CLT 05/07/2020, 9:00 AM  [facility] [address], [place], [address] Phone: [phone]   Fax:  [phone]  Name: Elijah Nelson MRN: [id number] Date of Birth: [birth year]

## 2020-05-12 ENCOUNTER — Encounter: Payer: Medicare Other | Admitting: Occupational Therapy

## 2020-05-14 ENCOUNTER — Ambulatory Visit: Payer: Medicare Other | Admitting: Occupational Therapy

## 2020-05-14 ENCOUNTER — Other Ambulatory Visit: Payer: Self-pay

## 2020-05-14 DIAGNOSIS — M25641 Stiffness of right hand, not elsewhere classified: Secondary | ICD-10-CM

## 2020-05-14 DIAGNOSIS — R600 Localized edema: Secondary | ICD-10-CM

## 2020-05-14 DIAGNOSIS — L905 Scar conditions and fibrosis of skin: Secondary | ICD-10-CM | POA: Diagnosis not present

## 2020-05-14 DIAGNOSIS — M25631 Stiffness of right wrist, not elsewhere classified: Secondary | ICD-10-CM

## 2020-05-14 DIAGNOSIS — M79641 Pain in right hand: Secondary | ICD-10-CM

## 2020-05-14 DIAGNOSIS — M6281 Muscle weakness (generalized): Secondary | ICD-10-CM

## 2020-05-14 NOTE — Therapy (Signed)
[facility] [address], [place], [address] Phone: [phone]   Fax:  [phone]  Occupational Therapy Treatment  Patient Details  Name: Elijah Nelson MRN: [id number] Date of Birth: 11/23/1940 Referring Provider (OT): Candace Gallus PA    Encounter Date: 05/14/2020   OT End of Session - 05/14/20 1216    Visit Number 13    Number of Visits 15    Date for OT Re-Evaluation 06/25/20    OT Start Time 0815    OT Stop Time 0858    OT Time Calculation (min) 43 min    Activity Tolerance Patient tolerated treatment well    Behavior During Therapy [facility] for tasks assessed/performed           Past Medical History:  Diagnosis Date  . Arthritis    neck  . Atrial fibrillation ([place])   . BPH (benign prostatic hyperplasia)   . Chronic headache   . GERD (gastroesophageal reflux disease)   . History of skin cancer   . History of skin cancer   . OSA (obstructive sleep apnea)    CPAP    Past Surgical History:  Procedure Laterality Date  . CATARACT EXTRACTION W/PHACO Right 07/23/2019   Procedure: CATARACT EXTRACTION PHACO AND INTRAOCULAR LENS PLACEMENT ([place]) RIGHT;  Surgeon: Birder Robson, MD;  Location: [place];  Service: Ophthalmology;  Laterality: Right;  1:10 16.7% 11.84  . CATARACT EXTRACTION W/PHACO Left 08/13/2019   Procedure: CATARACT EXTRACTION PHACO AND INTRAOCULAR LENS PLACEMENT ([facility]) LEFT 6.26  00:42.6;  Surgeon: Birder Robson, MD;  Location: [place];  Service: Ophthalmology;  Laterality: Left;  sleep apnea  . HERNIA REPAIR      There were no vitals filed for this visit.   Subjective Assessment - 05/14/20 0833    Subjective  I used my hand a lot -did some of the exercises - but not everything - can tell it is getting better- - scar looking much better than I ever thought it would    Pertinent History 02/08/2020 had accident working on fan when wrench was caught in  blades resulting in distal R radius fx - ORIF and plastic surgery were done 5/23 - and was in long cast then short cast - and stitches taken out 03/10/20 - and in removal wrist splint now -refer to OT to start hand therapy    Patient Stated Goals want to be albe to use my R hand to use tools, squeeze , lift , carry and pull/push objects    Currently in Pain? Yes    Pain Score 2     Pain Location Hand   Thumb   Pain Orientation Right    Pain Descriptors / Indicators Aching    Pain Type Chronic pain    Pain Onset More than a month ago    Pain Frequency Occasional              [facility] OT Assessment - 05/14/20 0001      AROM   Right Wrist Extension 60 Degrees    Right Wrist Flexion 90 Degrees      Strength   Right Hand Grip (lbs) 50    Right Hand Lateral Pinch 19 lbs    Right Hand 3 Point Pinch 16 lbs    Left Hand Grip (lbs) 104    Left Hand Lateral Pinch 27 lbs    Left Hand 3 Point Pinch 23 lbs  cont to make progress in AROM for R wrist        OT Treatments/Exercises (OP) - 05/14/20 0001      RUE Paraffin   Number Minutes Paraffin 8 Minutes    RUE Paraffin Location Wrist    Comments extention wrist stretch x 3 and priior to scar  massage              scar mobs done by OT - and using xtractor with wrist extention AAROm- pt to do scar mobs using piece of coban for more traction on scar And mini massager -and graston tool nr 2 for sweeping and brushing over volar hand , wrist and forearm - prior to ROM   AAROM and PROM for wrist extention -and flexion -with focus on extention more - open hand and composite fist  Can cont with Table slides - 20- reps- on towel roll with thumb off towel to decrease thumb pain Tendon glides - address with AAROM each -some edema and tightness over volar plate/MC still Cont as he can to do Hand gripperReview with pt last time and to cont with - increase to 25 lbs-2 x 12 reps - gripping and sustained grip hold 5  secpain free  And dark blue firm putty for lat and 3 point grip - 2 x 12 -15 reps pain free  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          OT Education - 05/14/20 1216    Education Details progress - and focus on scar massage , wrist extention stretch and grip    Person(s) Educated Patient    Methods Explanation;Demonstration;Tactile cues;Verbal cues;Handout    Comprehension Verbal cues required;Returned demonstration;Verbalized understanding            OT Short Term Goals - 05/07/20 0856      OT SHORT TERM GOAL #1   Title Pt to be independent in HEP to decrease edema and increase AROM in digits flexion to touch palm and thumb to hold tool , plate and cup    Status Achieved      OT SHORT TERM GOAL #2   Title R wrist AROM improve with more than 10-40 degrees to turn doorknob and use hand in more than 50% of ADL's    Status Achieved             OT Long Term Goals - 05/07/20 0857      OT LONG TERM GOAL #1   Title Pt to increase R hand grip strenght to more than 50% compare to L hand to grip tools and squeeze washclotth    Baseline grip strength R 50 ,L 99 lbs,- gripping tools increasing - progressing - but still not able to make tight composite fist- edema over MC's    Time 7    Period Weeks    Status On-going    Target Date 06/25/20      OT LONG TERM GOAL #2   Title R wrist AROM improve to [facility] to returning 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    Status Achieved      OT LONG TERM GOAL #3   Title Function score on PRWHE improve with more than 30 point    Baseline at eval PRWHE function score is 42.5/50 and now 2/50    Status Achieved                 Plan -  05/14/20 1217    Clinical Impression Statement Pt is about 13 1/2 wks s/p R distal radius fx with laceration - pt cont to make progress in scar tissue , wrist ROM and strenght- grip about the same - but stiffness and tightness still in making  composite fist - ? some arthritis- pt to cont iwth HEP and using R hand in ADL's and IADL's for 3 wks and follow up    OT Occupational Profile and History Problem Focused Assessment - Including review of records relating to presenting problem    Occupational performance deficits (Please refer to evaluation for details): ADL's;IADL's;Work;Play;Leisure    Body Structure / Function / Physical Skills ADL;ROM;UE functional use;Flexibility;FMC;Scar mobility;Dexterity;Edema;Pain;Strength;IADL    Rehab Potential Good    Clinical Decision Making Limited treatment options, no task modification necessary    Comorbidities Affecting Occupational Performance: None    Modification or Assistance to Complete Evaluation  No modification of tasks or assist necessary to complete eval    OT Frequency --   3 wks   OT Duration --   3 wks   OT Treatment/Interventions Self-care/ADL training;Splinting;Patient/family education;Paraffin;Fluidtherapy;Contrast Bath;Manual Therapy;Passive range of motion;Scar mobilization;Therapeutic exercise    Plan assess progress - possible discharge    Consulted and Agree with Plan of Care Patient           Patient will benefit from skilled therapeutic intervention in order to improve the following deficits and impairments:   Body Structure / Function / Physical Skills: ADL, ROM, UE functional use, Flexibility, FMC, Scar mobility, Dexterity, Edema, Pain, Strength, IADL       Visit Diagnosis: Scar condition and fibrosis of skin  Stiffness of right hand, not elsewhere classified  Stiffness of right wrist, not elsewhere classified  Muscle weakness (generalized)  Pain in right hand  Localized edema    Problem List Patient Active Problem List   Diagnosis Date Noted  . Benign prostatic hyperplasia with lower urinary tract symptoms 06/18/2018  . History of elevated PSA 06/18/2018  . Atrial fibrillation with RVR ([place]) 09/18/2017  . Chest pain 09/18/2017  . GERD  (gastroesophageal reflux disease) 09/18/2017  . OSA (obstructive sleep apnea) 09/18/2017    Rosalyn Gess OTR/l,CLT 05/14/2020, 12:20 PM  [facility] [address] [place], [address] Phone: [phone]   Fax:  [phone]  Name: KITAI PURDOM MRN: [id number] Date of Birth: 05-02-1941

## 2020-06-04 ENCOUNTER — Other Ambulatory Visit: Payer: Self-pay

## 2020-06-04 ENCOUNTER — Ambulatory Visit: Payer: Medicare Other | Attending: Physician Assistant | Admitting: Occupational Therapy

## 2020-06-04 DIAGNOSIS — M6281 Muscle weakness (generalized): Secondary | ICD-10-CM | POA: Diagnosis present

## 2020-06-04 DIAGNOSIS — M79641 Pain in right hand: Secondary | ICD-10-CM

## 2020-06-04 DIAGNOSIS — M25641 Stiffness of right hand, not elsewhere classified: Secondary | ICD-10-CM | POA: Diagnosis present

## 2020-06-04 DIAGNOSIS — M25631 Stiffness of right wrist, not elsewhere classified: Secondary | ICD-10-CM | POA: Diagnosis present

## 2020-06-04 DIAGNOSIS — R600 Localized edema: Secondary | ICD-10-CM

## 2020-06-04 DIAGNOSIS — L905 Scar conditions and fibrosis of skin: Secondary | ICD-10-CM | POA: Diagnosis present

## 2020-06-04 NOTE — Therapy (Signed)
[facility] [address] [place], [address] Phone: [phone]   Fax:  [phone]  Occupational Therapy Treatment/discharge  Patient Details  Name: Elijah Nelson MRN: [id number] Date of Birth: 1940/10/16 Referring Provider (OT): Candace Gallus PA    Encounter Date: 06/04/2020   OT End of Session - 06/04/20 0842    Visit Number 14    Number of Visits 14    Date for OT Re-Evaluation 06/04/20    OT Start Time 0814    OT Stop Time 0837    OT Time Calculation (min) 23 min    Activity Tolerance Patient tolerated treatment well    Behavior During Therapy [facility] for tasks assessed/performed           Past Medical History:  Diagnosis Date  . Arthritis    neck  . Atrial fibrillation ([place])   . BPH (benign prostatic hyperplasia)   . Chronic headache   . GERD (gastroesophageal reflux disease)   . History of skin cancer   . History of skin cancer   . OSA (obstructive sleep apnea)    CPAP    Past Surgical History:  Procedure Laterality Date  . CATARACT EXTRACTION W/PHACO Right 07/23/2019   Procedure: CATARACT EXTRACTION PHACO AND INTRAOCULAR LENS PLACEMENT ([place]) RIGHT;  Surgeon: Birder Robson, MD;  Location: [place];  Service: Ophthalmology;  Laterality: Right;  1:10 16.7% 11.84  . CATARACT EXTRACTION W/PHACO Left 08/13/2019   Procedure: CATARACT EXTRACTION PHACO AND INTRAOCULAR LENS PLACEMENT ([facility]) LEFT 6.26  00:42.6;  Surgeon: Birder Robson, MD;  Location: [place];  Service: Ophthalmology;  Laterality: Left;  sleep apnea  . HERNIA REPAIR      There were no vitals filed for this visit.   Subjective Assessment - 06/04/20 0841    Subjective  Doing well - cannot make tight fist yet- but that will take time - using my hand in most everything can use plyers now and open bottle tops - about 90 % back to prior to injury    Pertinent History 02/08/2020 had accident working on fan  when wrench was caught in blades resulting in distal R radius fx - ORIF and plastic surgery were done 5/23 - and was in long cast then short cast - and stitches taken out 03/10/20 - and in removal wrist splint now -refer to OT to start hand therapy    Patient Stated Goals want to be albe to use my R hand to use tools, squeeze , lift , carry and pull/push objects    Currently in Pain? No/denies              [facility] OT Assessment - 06/04/20 0001      AROM   Right Wrist Extension 65 Degrees    Right Wrist Flexion 90 Degrees    Right Wrist Radial Deviation 20 Degrees    Right Wrist Ulnar Deviation 40 Degrees      Strength   Right Hand Grip (lbs) 60    Right Hand Lateral Pinch 21 lbs    Right Hand 3 Point Pinch 19 lbs    Left Hand Grip (lbs) 104    Left Hand Lateral Pinch 27 lbs    Left Hand 3 Point Pinch 23 lbs      Right Hand AROM   R Index  MCP 0-90 80 Degrees    R Index PIP 0-100 90 Degrees    R Long  MCP 0-90 85  Degrees    R Long PIP 0-100 90 Degrees    R Ring  MCP 0-90 80 Degrees    R Ring PIP 0-100 100 Degrees    R Little  MCP 0-90 85 Degrees    R Little PIP 0-100 95 Degrees           measurements taken - see flow sheet  - cont to show progress with using R hand and wrist at home and work - pt very active  Pt to cont just using his hand - but keep pain under 2/10  Has follow up with surgeon on November per pt                   OT Education - 06/04/20 0842    Education Details discharge instructions    Person(s) Educated Patient    Methods Explanation;Demonstration;Tactile cues;Verbal cues;Handout    Comprehension Verbal cues required;Returned demonstration;Verbalized understanding            OT Short Term Goals - 05/07/20 0856      OT SHORT TERM GOAL #1   Title Pt to be independent in HEP to decrease edema and increase AROM in digits flexion to touch palm and thumb to hold tool , plate and cup    Status Achieved      OT SHORT TERM GOAL #2    Title R wrist AROM improve with more than 10-40 degrees to turn doorknob and use hand in more than 50% of ADL's    Status Achieved             OT Long Term Goals - 06/04/20 0856      OT LONG TERM GOAL #1   Title Pt to increase R hand grip strenght to more than 50% compare to L hand to grip tools and squeeze washclotth    Baseline Grip R 60 , L 104 lbs and using hand in 90% of activities    Status Achieved      OT LONG TERM GOAL #2   Title R wrist AROM improve to [facility] to returning 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    Status Achieved      OT LONG TERM GOAL #3   Title Function score on PRWHE improve with more than 30 point    Baseline at eval PRWHE function score is 42.5/50 and now 2/50    Status Achieved                 Plan - 06/04/20 0843    Clinical Impression Statement Pt is in next week 4 months out from s/p R distal radius fx with laceration - pt cont to make progress in scar tissue , ROM and strength- pt report able to use R hand in most everything a out 90% back to prior level - grip and prehension improved - pt discharge at this time    OT Occupational Profile and History Problem Focused Assessment - Including review of records relating to presenting problem    Occupational performance deficits (Please refer to evaluation for details): ADL's;IADL's;Work;Play;Leisure    Body Structure / Function / Physical Skills ADL;ROM;UE functional use;Flexibility;FMC;Scar mobility;Dexterity;Edema;Pain;Strength;IADL    Rehab Potential Good    Clinical Decision Making Limited treatment options, no task modification necessary    Comorbidities Affecting Occupational Performance: None    Modification or Assistance to Complete Evaluation  No modification of tasks or assist necessary to complete eval    Consulted and  Agree with Plan of Care Patient           Patient will benefit from skilled therapeutic intervention in order to improve the following  deficits and impairments:   Body Structure / Function / Physical Skills: ADL, ROM, UE functional use, Flexibility, FMC, Scar mobility, Dexterity, Edema, Pain, Strength, IADL       Visit Diagnosis: Scar condition and fibrosis of skin  Stiffness of right hand, not elsewhere classified  Stiffness of right wrist, not elsewhere classified  Muscle weakness (generalized)  Pain in right hand  Localized edema    Problem List Patient Active Problem List   Diagnosis Date Noted  . Benign prostatic hyperplasia with lower urinary tract symptoms 06/18/2018  . History of elevated PSA 06/18/2018  . Atrial fibrillation with RVR ([place]) 09/18/2017  . Chest pain 09/18/2017  . GERD (gastroesophageal reflux disease) 09/18/2017  . OSA (obstructive sleep apnea) 09/18/2017    Rosalyn Gess OTR/l,CLT 06/04/2020, 8:58 AM  [facility] [address] [place], [address] Phone: [phone]   Fax:  [phone]  Name: Elijah Nelson MRN: [id number] Date of Birth: 1941-03-04

## 2020-07-24 ENCOUNTER — Ambulatory Visit (INDEPENDENT_AMBULATORY_CARE_PROVIDER_SITE_OTHER): Payer: Medicare Other | Admitting: Podiatry

## 2020-07-24 ENCOUNTER — Encounter: Payer: Self-pay | Admitting: Podiatry

## 2020-07-24 ENCOUNTER — Ambulatory Visit (INDEPENDENT_AMBULATORY_CARE_PROVIDER_SITE_OTHER): Payer: Medicare Other

## 2020-07-24 ENCOUNTER — Other Ambulatory Visit: Payer: Self-pay

## 2020-07-24 DIAGNOSIS — N529 Male erectile dysfunction, unspecified: Secondary | ICD-10-CM | POA: Insufficient documentation

## 2020-07-24 DIAGNOSIS — M722 Plantar fascial fibromatosis: Secondary | ICD-10-CM | POA: Diagnosis not present

## 2020-07-24 DIAGNOSIS — D696 Thrombocytopenia, unspecified: Secondary | ICD-10-CM | POA: Insufficient documentation

## 2020-07-24 DIAGNOSIS — E781 Pure hyperglyceridemia: Secondary | ICD-10-CM | POA: Insufficient documentation

## 2020-07-24 DIAGNOSIS — K8020 Calculus of gallbladder without cholecystitis without obstruction: Secondary | ICD-10-CM | POA: Insufficient documentation

## 2020-07-24 DIAGNOSIS — G930 Cerebral cysts: Secondary | ICD-10-CM | POA: Insufficient documentation

## 2020-07-24 MED ORDER — MELOXICAM 15 MG PO TABS
15.0000 mg | ORAL_TABLET | Freq: Every day | ORAL | 1 refills | Status: DC
Start: 2020-07-24 — End: 2023-01-20

## 2020-07-24 NOTE — Progress Notes (Signed)
   Subjective: 79 y.o. male presenting today for right heel pain has been going on approximately 1 week.  Patient does not recall a history of injury.  Painful in the mornings when standing from a sitting position.  He has been taking ibuprofen and changing his shoes with minimal relief.  He presents for further treatment and evaluation   Past Medical History:  Diagnosis Date  . Arthritis    neck  . Atrial fibrillation ([place])   . BPH (benign prostatic hyperplasia)   . Chronic headache   . GERD (gastroesophageal reflux disease)   . History of skin cancer   . History of skin cancer   . OSA (obstructive sleep apnea)    CPAP     Objective: Physical Exam General: The patient is alert and oriented x3 in no acute distress.  Dermatology: Skin is warm, dry and supple bilateral lower extremities. Negative for open lesions or macerations bilateral.   Vascular: Dorsalis Pedis and Posterior Tibial pulses palpable bilateral.  Capillary fill time is immediate to all digits.  Neurological: Epicritic and protective threshold intact bilateral.   Musculoskeletal: Tenderness to palpation to the plantar aspect of the right heel along the plantar fascia. All other joints range of motion within normal limits bilateral. Strength 5/5 in all groups bilateral.   Radiographic exam: Normal osseous mineralization. Joint spaces preserved. No fracture/dislocation/boney destruction. No other soft tissue abnormalities or radiopaque foreign bodies.   Assessment: 1. Plantar fasciitis right  Plan of Care:  1. Patient evaluated. Xrays reviewed.   2. Injection of 0.5cc Celestone soluspan injected into the right plantar fascia  3 Rx for Meloxicam ordered for patient. 4. Plantar fascial band(s) dispensed 5. Instructed patient regarding therapies and modalities at home to alleviate symptoms.  6. Return to clinic in 4 weeks.    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 Edrick Kins, DPM    [address], [place] [address]                Office [phone]  Fax [phone]

## 2020-08-13 ENCOUNTER — Emergency Department
Admission: EM | Admit: 2020-08-13 | Discharge: 2020-08-13 | Disposition: A | Discharge status: Home / Self Care | Payer: Medicare Other | Attending: Emergency Medicine | Admitting: Emergency Medicine

## 2020-08-13 ENCOUNTER — Other Ambulatory Visit: Payer: Self-pay

## 2020-08-13 ENCOUNTER — Emergency Department: Payer: Medicare Other

## 2020-08-13 DIAGNOSIS — Z85828 Personal history of other malignant neoplasm of skin: Secondary | ICD-10-CM | POA: Insufficient documentation

## 2020-08-13 DIAGNOSIS — R55 Syncope and collapse: Secondary | ICD-10-CM | POA: Insufficient documentation

## 2020-08-13 DIAGNOSIS — Z7982 Long term (current) use of aspirin: Secondary | ICD-10-CM | POA: Insufficient documentation

## 2020-08-13 LAB — COMPREHENSIVE METABOLIC PANEL
ALT: 26 U/L (ref 0–44)
AST: 31 U/L (ref 15–41)
Albumin: 3.7 g/dL (ref 3.5–5.0)
Alkaline Phosphatase: 86 U/L (ref 38–126)
Anion gap: 11 (ref 5–15)
BUN: 20 mg/dL (ref 8–23)
CO2: 22 mmol/L (ref 22–32)
Calcium: 8.5 mg/dL — ABNORMAL LOW (ref 8.9–10.3)
Chloride: 108 mmol/L (ref 98–111)
Creatinine, Ser: 1.07 mg/dL (ref 0.61–1.24)
GFR, Estimated: >60 mL/min (ref >60)
Glucose, Bld: 100 mg/dL — ABNORMAL HIGH (ref 70–99)
Potassium: 3.7 mmol/L (ref 3.5–5.1)
Sodium: 141 mmol/L (ref 135–145)
Total Bilirubin: 2 mg/dL — ABNORMAL HIGH (ref 0.3–1.2)
Total Protein: 6.5 g/dL (ref 6.5–8.1)

## 2020-08-13 LAB — CBC
HCT: 37.9 % — ABNORMAL LOW (ref 39.0–52.0)
Hemoglobin: 12.5 g/dL — ABNORMAL LOW (ref 13.0–17.0)
MCH: 31.8 pg (ref 26.0–34.0)
MCHC: 33 g/dL (ref 30.0–36.0)
MCV: 96.4 fL (ref 80.0–100.0)
Platelets: 142 10*3/uL — ABNORMAL LOW (ref 150–400)
RBC: 3.93 MIL/uL — ABNORMAL LOW (ref 4.22–5.81)
RDW: 13.5 % (ref 11.5–15.5)
WBC: 7.1 10*3/uL (ref 4.0–10.5)
nRBC: 0 % (ref 0.0–0.2)

## 2020-08-13 LAB — TROPONIN I (HIGH SENSITIVITY)
Troponin I (High Sensitivity): 10 ng/L (ref <18)
Troponin I (High Sensitivity): 9 ng/L (ref <18)

## 2020-08-13 MED ORDER — SODIUM CHLORIDE 0.9 % IV BOLUS
500.0000 mL | Freq: Once | INTRAVENOUS | Status: AC
  Administered 2020-08-13: 500 mL via INTRAVENOUS

## 2020-08-13 NOTE — ED Notes (Signed)
EDP at bedside  

## 2020-08-13 NOTE — ED Notes (Signed)
Pt back from CT

## 2020-08-13 NOTE — ED Notes (Signed)
Pt went to CT

## 2020-08-13 NOTE — ED Notes (Signed)
Wife at bedside. Wife states pt ws sitting at table after thanksgiving meal and became diaphoretic and unresponsive for a few seconds but did not have syncope. Wife states that pt had R arm drift at this time. This happened at about 1500. Wife states that pt is on new BP med since about 2wk ago.

## 2020-08-13 NOTE — ED Triage Notes (Addendum)
Pt arrives via EMS from home after eating lunch pt states he "felt grey" and then had a syncopal episode- pt states he has had these before but has not told anyone and they have never been this bad- pt now A&O x4 with no complaints- pt received 542ml fluid by EMS- pt has a hx of afib

## 2020-08-13 NOTE — Discharge Instructions (Addendum)
Contact your primary care doctor within the next several days for follow-up next week.  Return to the ER for new or recurrent weakness or lightheadedness, passing out, low blood pressure readings, or any other new or worsening symptoms that concern you.

## 2020-08-13 NOTE — ED Provider Notes (Signed)
[facility] Emergency Department Provider Note ____________________________________________   First MD Initiated Contact with Patient 08/13/20 1538     (approximate)  I have reviewed the triage vital signs and the nursing notes.   HISTORY  Chief Complaint Loss of Consciousness    HPI Elijah Nelson is a 79 y.o. male with PMH as noted below including paroxysmal atrial fibrillation and hypertension recently started on losartan who presents with an episode of syncope.  Per the patient and his wife, he had just finished his Thanksgiving meal.  He states that he felt lightheaded and "grey," then became sweaty, and lost consciousness.  The wife states that he remained in his chair and did not fall, but was not responsive.  When he started to come to he lifted his arms although he seemed to do so somewhat asymmetrically.  The patient denies any chest pain, palpitations, shortness of breath.  He has no weakness or numbness now.  Denies a headache.  He states he felt a little bit lightheaded and off when he sat down for the meal.  He had a glass of wine and a glass of champagne this afternoon.  He states that he has had several other near syncopal episodes over the last few months but did not tell anyone or come to get evaluated for them.  He states that when he has paroxysmal atrial fibrillation, it feels different and he does not feel lightheaded or like he will pass out.  Past Medical History:  Diagnosis Date  . Arthritis    neck  . Atrial fibrillation ([place])   . BPH (benign prostatic hyperplasia)   . Chronic headache   . GERD (gastroesophageal reflux disease)   . History of skin cancer   . History of skin cancer   . OSA (obstructive sleep apnea)    CPAP    Patient Active Problem List   Diagnosis Date Noted  . 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 Gallstones 07/24/2020  . Hypertriglyceridemia 07/24/2020  . ED (erectile dysfunction) 07/24/2020  . Thrombocytopenia  ([place]) 07/24/2020  . Closed extraarticular fracture of distal end of right radius 02/26/2020  . Laceration of finger of right hand 02/26/2020  . Radial shaft fracture 02/09/2020  . Benign prostatic hyperplasia with lower urinary tract symptoms 06/18/2018  . History of elevated PSA 06/18/2018  . Atrial fibrillation with RVR ([place]) 09/18/2017  . Chest pain 09/18/2017  . GERD (gastroesophageal reflux disease) 09/18/2017  . OSA (obstructive sleep apnea) 09/18/2017  . B12 deficiency 12/05/2016  . Essential hypertension 12/05/2016  . Incomplete emptying of bladder 06/19/2014  . Reduced libido 08/30/2013  . 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 Elevated prostate specific antigen (PSA) 06/08/2012  . Urinary urgency 06/08/2012    Past Surgical History:  Procedure Laterality Date  . CATARACT EXTRACTION W/PHACO Right 07/23/2019   Procedure: CATARACT EXTRACTION PHACO AND INTRAOCULAR LENS PLACEMENT ([place]) RIGHT;  Surgeon: Birder Robson, MD;  Location: [place];  Service: Ophthalmology;  Laterality: Right;  1:10 16.7% 11.84  . CATARACT EXTRACTION W/PHACO Left 08/13/2019   Procedure: CATARACT EXTRACTION PHACO AND INTRAOCULAR LENS PLACEMENT ([facility]) LEFT 6.26  00:42.6;  Surgeon: Birder Robson, MD;  Location: [place];  Service: Ophthalmology;  Laterality: Left;  sleep apnea  . HERNIA REPAIR      Prior to Admission medications   Medication Sig Start Date End Date Taking? Authorizing Provider  aspirin 81 MG EC tablet Take 81 mg by mouth daily. 06/11/12  [provider]  atorvastatin (LIPITOR) 40 MG tablet  05/16/18   [provider]  glucosamine-chondroitin 500-400 MG tablet Take 2 tablets by mouth daily.    [provider]  meloxicam (MOBIC) 15 MG tablet Take 1 tablet (15 mg total) by mouth daily. 07/24/20   Edrick Kins, DPM  Multiple Vitamin (MULTI-VITAMINS) TABS Take by mouth.    [provider]  omega-3 acid  ethyl esters (LOVAZA) 1 g capsule Take 1 g by mouth daily.    [provider]  omeprazole (PRILOSEC) 20 MG capsule Take 20 mg by mouth daily. 06/11/12   [provider]  tamsulosin (FLOMAX) 0.4 MG CAPS capsule Take 1 capsule (0.4 mg total) by mouth every other day. 11/02/18   Stoioff, Ronda Fairly, MD  Turmeric 500 MG CAPS Take by mouth.    [provider]  vitamin B-12 (CYANOCOBALAMIN) 500 MCG tablet Take 500 mcg by mouth daily.    [provider]    Allergies Cyclobenzaprine, Morphine, Oxycodone-acetaminophen, Tadalafil, and Tamsulosin  Family History  Problem Relation Age of Onset  . Breast cancer Mother   . Heart attack Father   . Heart disease Father     Social History Social History   Tobacco Use  . Smoking status: Never Smoker  . Smokeless tobacco: Never Used  Vaping Use  . Vaping Use: Never used  Substance Use Topics  . Alcohol use: Yes    Alcohol/week: 7.0 standard drinks    Types: 7 Glasses of wine per week  . Drug use: No    Review of Systems  Constitutional: No fever. Eyes: No redness. ENT: No sore throat. Cardiovascular: Denies chest pain. Respiratory: Denies shortness of breath. Gastrointestinal: No vomiting or diarrhea.  Genitourinary: Negative for dysuria.  Musculoskeletal: Negative for back pain. Skin: Negative for rash. Neurological: Negative for headaches, focal weakness or numbness.   ____________________________________________   PHYSICAL EXAM:  VITAL SIGNS: ED Triage Vitals  Enc Vitals Group     BP 08/13/20 1534 (!) 104/56     Pulse Rate 08/13/20 1534 69     Resp 08/13/20 1534 18     Temp 08/13/20 1534 (!) 97.5 F (36.4 C)     Temp Source 08/13/20 1534 Oral     SpO2 08/13/20 1534 97 %     Weight 08/13/20 1535 208 lb (94.3 kg)     Height 08/13/20 1535 6\' 3"  (1.905 m)     Head Circumference --      Peak Flow --      Pain Score 08/13/20 1534 0     Pain Loc --      Pain Edu? --      Excl. in [place]? --      Constitutional: Alert and oriented. Well appearing and in no acute distress. Eyes: Conjunctivae are normal.  EOMI.  PERRLA. Head: Atraumatic. Nose: No congestion/rhinnorhea. Mouth/Throat: Mucous membranes are moist.   Neck: Normal range of motion.  Cardiovascular: Normal rate, regular rhythm. Grossly normal heart sounds.  Good peripheral circulation. Respiratory: Normal respiratory effort.  No retractions. Lungs CTAB. Gastrointestinal: No distention.  Musculoskeletal: No lower extremity edema.  Extremities warm and well perfused.  Neurologic:  Normal speech and language.  No facial droop.  5/5 motor strength and intact sensation to all extremities.  No pronator drift.  Normal coordination on finger-to-nose.   Skin:  Skin is warm and dry. No rash noted. Psychiatric: Mood and affect are normal. Speech and behavior are normal.  ____________________________________________  LABS (all labs ordered are listed, but only abnormal results are displayed)  Labs Reviewed  CBC - Abnormal; Notable for the following components:      Result Value   RBC 3.93 (*)    Hemoglobin 12.5 (*)    HCT 37.9 (*)    Platelets 142 (*)    All other components within normal limits  COMPREHENSIVE METABOLIC PANEL - Abnormal; Notable for the following components:   Glucose, Bld 100 (*)    Calcium 8.5 (*)    Total Bilirubin 2.0 (*)    All other components within normal limits  URINALYSIS, COMPLETE (UACMP) WITH MICROSCOPIC  TROPONIN I (HIGH SENSITIVITY)  TROPONIN I (HIGH SENSITIVITY)   ____________________________________________  EKG  ED ECG REPORT I, Arta Silence, the attending physician, personally viewed and interpreted this ECG.  Date: 08/13/2020 EKG Time: 1537 Rate: 70 Rhythm: normal sinus rhythm QRS Axis: normal Intervals: normal ST/T Wave abnormalities: normal Narrative Interpretation: no evidence of acute ischemia  ____________________________________________  RADIOLOGY   CT  head: No acute abnormality ____________________________________________   PROCEDURES  Procedure(s) performed: No  Procedures  Critical Care performed: No ____________________________________________   INITIAL IMPRESSION / ASSESSMENT AND PLAN / ED COURSE  Pertinent labs & imaging results that were available during my care of the patient were reviewed by me and considered in my medical decision making (see chart for details).  79 year old male with PMH as noted above presents with an episode of syncope with a prodrome of lightheadedness and diaphoresis.  It occurred right after he ate a large Thanksgiving meal.  He has had a few other episodes of near syncope over the last couple months.  He reports starting on a blood pressure medication a few weeks ago.  I reviewed the past medical records in [place].  The patient has no recent prior ED visits or admissions here, except for a visit in May for an arm injury.  On exam he is overall well-appearing.  His vital signs are normal except for borderline low blood pressure.  Neurologic exam is normal.  The physical exam is otherwise unremarkable.  Overall presentation is most consistent with vasovagal syncope especially given the prodrome and the fact that the patient had just eaten a large meal and had some alcohol.  I have a low suspicion for cardiac etiology such as paroxysmal atrial fibrillation or other arrhythmia.  I also have a low suspicion for TIA.  Patient has no neurologic deficits at this time.  Will obtain a CT head, lab work-up including troponin, give a fluid bolus, and reassess.  ----------------------------------------- 6:53 PM on 08/13/2020 -----------------------------------------  CT head shows no acute findings.  Lab work-up is unremarkable.  Troponins x2 are negative.  The patient's blood pressure has normalized.  At this time, there is no evidence of arrhythmia or other concerning acute etiology.  I offered the patient  observation admission, however he prefers to go home and contact his doctor for follow-up early next week.  I feel this is reasonable.  The patient's wife, who is here with him, is an ICU nurse.  We had a thorough discussion about follow-up and return precautions; the patient and his wife expressed understanding.   ____________________________________________   FINAL CLINICAL IMPRESSION(S) / ED DIAGNOSES  Final diagnoses:  Syncope, unspecified syncope type      NEW MEDICATIONS STARTED DURING THIS VISIT:  New Prescriptions   No medications on file     Note:  This document was prepared using Dragon voice recognition software and  may include unintentional dictation errors.    Arta Silence, MD 08/13/20 [phone]

## 2020-08-21 ENCOUNTER — Other Ambulatory Visit: Payer: Self-pay

## 2020-08-21 ENCOUNTER — Ambulatory Visit (INDEPENDENT_AMBULATORY_CARE_PROVIDER_SITE_OTHER): Payer: Medicare Other | Admitting: Podiatry

## 2020-08-21 ENCOUNTER — Encounter: Payer: Self-pay | Admitting: Podiatry

## 2020-08-21 DIAGNOSIS — M722 Plantar fascial fibromatosis: Secondary | ICD-10-CM | POA: Diagnosis not present

## 2020-08-21 MED ORDER — BETAMETHASONE SOD PHOS & ACET 6 (3-3) MG/ML IJ SUSP
3.0000 mg | Freq: Once | INTRAMUSCULAR | Status: AC
  Administered 2020-08-21: 3 mg via INTRAMUSCULAR

## 2020-08-21 NOTE — Progress Notes (Signed)
   Subjective: 79 y.o. male presenting today for follow-up evaluation of plantar fasciitis to the right heel.  Patient states that he steadily is improving.  He believes the meloxicam and injection helped significantly.  He also wears a plantar fascial brace daily.  No new complaints at this time  Past Medical History:  Diagnosis Date  . Arthritis    neck  . Atrial fibrillation ([place])   . BPH (benign prostatic hyperplasia)   . Chronic headache   . GERD (gastroesophageal reflux disease)   . History of skin cancer   . History of skin cancer   . OSA (obstructive sleep apnea)    CPAP     Objective: Physical Exam General: The patient is alert and oriented x3 in no acute distress.  Dermatology: Skin is warm, dry and supple bilateral lower extremities. Negative for open lesions or macerations bilateral.   Vascular: Dorsalis Pedis and Posterior Tibial pulses palpable bilateral.  Capillary fill time is immediate to all digits.  Neurological: Epicritic and protective threshold intact bilateral.   Musculoskeletal: Tenderness to palpation to the plantar aspect of the right heel along the plantar fascia. All other joints range of motion within normal limits bilateral. Strength 5/5 in all groups bilateral.   Assessment: 1. Plantar fasciitis right  Plan of Care:  1. Patient evaluated.  2. Injection of 0.5cc Celestone soluspan injected into the right plantar fascia  3 continue meloxicam as needed 4.  Continue plantar fascial brace 5. Instructed patient regarding therapies and modalities at home to alleviate symptoms.  6. Return to clinic as needed  *Owns a custom pitcher framing business   Edrick Kins, DPM [facility]  Dr. Edrick Kins, DPM    [address], [place] [address]                Office [phone]  Fax [phone]

## 2021-06-23 HISTORY — PX: LEFT ATRIAL APPENDAGE OCCLUSION: SHX173A

## 2021-09-15 ENCOUNTER — Emergency Department: Payer: Medicare Other

## 2021-09-15 ENCOUNTER — Emergency Department
Admission: EM | Admit: 2021-09-15 | Discharge: 2021-09-15 | Disposition: A | Discharge status: Home / Self Care | Payer: Medicare Other | Attending: Emergency Medicine | Admitting: Emergency Medicine

## 2021-09-15 ENCOUNTER — Encounter: Payer: Self-pay | Admitting: Emergency Medicine

## 2021-09-15 ENCOUNTER — Other Ambulatory Visit: Payer: Self-pay

## 2021-09-15 DIAGNOSIS — Y900 Blood alcohol level of less than 20 mg/100 ml: Secondary | ICD-10-CM | POA: Insufficient documentation

## 2021-09-15 DIAGNOSIS — I4891 Unspecified atrial fibrillation: Secondary | ICD-10-CM | POA: Insufficient documentation

## 2021-09-15 DIAGNOSIS — Z85828 Personal history of other malignant neoplasm of skin: Secondary | ICD-10-CM | POA: Insufficient documentation

## 2021-09-15 DIAGNOSIS — Z7982 Long term (current) use of aspirin: Secondary | ICD-10-CM | POA: Diagnosis not present

## 2021-09-15 DIAGNOSIS — R42 Dizziness and giddiness: Secondary | ICD-10-CM | POA: Diagnosis not present

## 2021-09-15 DIAGNOSIS — Z79899 Other long term (current) drug therapy: Secondary | ICD-10-CM | POA: Insufficient documentation

## 2021-09-15 DIAGNOSIS — Z20822 Contact with and (suspected) exposure to covid-19: Secondary | ICD-10-CM | POA: Insufficient documentation

## 2021-09-15 DIAGNOSIS — R519 Headache, unspecified: Secondary | ICD-10-CM | POA: Insufficient documentation

## 2021-09-15 DIAGNOSIS — G930 Cerebral cysts: Secondary | ICD-10-CM

## 2021-09-15 DIAGNOSIS — I10 Essential (primary) hypertension: Secondary | ICD-10-CM | POA: Insufficient documentation

## 2021-09-15 DIAGNOSIS — R531 Weakness: Secondary | ICD-10-CM | POA: Diagnosis present

## 2021-09-15 LAB — ETHANOL: Alcohol, Ethyl (B): <10 mg/dL (ref <10)

## 2021-09-15 LAB — URINE DRUG SCREEN, QUALITATIVE (ARMC ONLY)
Amphetamines, Ur Screen: NOT DETECTED
Barbiturates, Ur Screen: NOT DETECTED
Benzodiazepine, Ur Scrn: NOT DETECTED
Cannabinoid 50 Ng, Ur ~~LOC~~: NOT DETECTED
Cocaine Metabolite,Ur ~~LOC~~: NOT DETECTED
MDMA (Ecstasy)Ur Screen: NOT DETECTED
Methadone Scn, Ur: NOT DETECTED
Opiate, Ur Screen: NOT DETECTED
Phencyclidine (PCP) Ur S: NOT DETECTED
Tricyclic, Ur Screen: NOT DETECTED

## 2021-09-15 LAB — CBC
HCT: 38.7 % — ABNORMAL LOW (ref 39.0–52.0)
Hemoglobin: 13.3 g/dL (ref 13.0–17.0)
MCH: 32.8 pg (ref 26.0–34.0)
MCHC: 34.4 g/dL (ref 30.0–36.0)
MCV: 95.3 fL (ref 80.0–100.0)
Platelets: 161 10*3/uL (ref 150–400)
RBC: 4.06 MIL/uL — ABNORMAL LOW (ref 4.22–5.81)
RDW: 13.3 % (ref 11.5–15.5)
WBC: 11.8 10*3/uL — ABNORMAL HIGH (ref 4.0–10.5)
nRBC: 0 % (ref 0.0–0.2)

## 2021-09-15 LAB — URINALYSIS, ROUTINE W REFLEX MICROSCOPIC
Bilirubin Urine: NEGATIVE
Glucose, UA: NEGATIVE mg/dL
Hgb urine dipstick: NEGATIVE
Ketones, ur: NEGATIVE mg/dL
Leukocytes,Ua: NEGATIVE
Nitrite: NEGATIVE
Protein, ur: NEGATIVE mg/dL
Specific Gravity, Urine: 1.018 (ref 1.005–1.030)
pH: 7 (ref 5.0–8.0)

## 2021-09-15 LAB — BASIC METABOLIC PANEL
Anion gap: 11 (ref 5–15)
BUN: 15 mg/dL (ref 8–23)
CO2: 25 mmol/L (ref 22–32)
Calcium: 9.3 mg/dL (ref 8.9–10.3)
Chloride: 101 mmol/L (ref 98–111)
Creatinine, Ser: 0.92 mg/dL (ref 0.61–1.24)
GFR, Estimated: >60 mL/min (ref >60)
Glucose, Bld: 139 mg/dL — ABNORMAL HIGH (ref 70–99)
Potassium: 2.8 mmol/L — ABNORMAL LOW (ref 3.5–5.1)
Sodium: 137 mmol/L (ref 135–145)

## 2021-09-15 LAB — RESP PANEL BY RT-PCR (FLU A&B, COVID) ARPGX2
Influenza A by PCR: NEGATIVE
Influenza B by PCR: NEGATIVE
SARS Coronavirus 2 by RT PCR: NEGATIVE

## 2021-09-15 LAB — TROPONIN I (HIGH SENSITIVITY)
Troponin I (High Sensitivity): 6 ng/L (ref <18)
Troponin I (High Sensitivity): 7 ng/L (ref <18)

## 2021-09-15 MED ORDER — DIPHENHYDRAMINE HCL 50 MG/ML IJ SOLN
25.0000 mg | Freq: Once | INTRAMUSCULAR | Status: AC
  Administered 2021-09-15: 11:00:00 25 mg via INTRAVENOUS
  Filled 2021-09-15: qty 1

## 2021-09-15 MED ORDER — PROCHLORPERAZINE EDISYLATE 10 MG/2ML IJ SOLN
10.0000 mg | Freq: Once | INTRAMUSCULAR | Status: AC
  Administered 2021-09-15: 11:00:00 10 mg via INTRAVENOUS
  Filled 2021-09-15: qty 2

## 2021-09-15 MED ORDER — LACTATED RINGERS IV BOLUS
1000.0000 mL | Freq: Once | INTRAVENOUS | Status: AC
  Administered 2021-09-15: 02:00:00 1000 mL via INTRAVENOUS

## 2021-09-15 MED ORDER — POTASSIUM CHLORIDE 10 MEQ/100ML IV SOLN
10.0000 meq | INTRAVENOUS | Status: AC
  Administered 2021-09-15 (×2): 10 meq via INTRAVENOUS
  Filled 2021-09-15 (×2): qty 100

## 2021-09-15 MED ORDER — ACETAMINOPHEN 500 MG PO TABS
1000.0000 mg | ORAL_TABLET | Freq: Once | ORAL | Status: AC
  Administered 2021-09-15: 08:00:00 1000 mg via ORAL
  Filled 2021-09-15: qty 2

## 2021-09-15 MED ORDER — MECLIZINE HCL 25 MG PO TABS
25.0000 mg | ORAL_TABLET | Freq: Once | ORAL | Status: AC
  Administered 2021-09-15: 03:00:00 25 mg via ORAL
  Filled 2021-09-15: qty 1

## 2021-09-15 MED ORDER — MECLIZINE HCL 25 MG PO TABS
25.0000 mg | ORAL_TABLET | Freq: Three times a day (TID) | ORAL | 0 refills | Status: DC | PRN
Start: 2021-09-15 — End: 2023-01-20

## 2021-09-15 MED ORDER — ONDANSETRON HCL 4 MG/2ML IJ SOLN
4.0000 mg | Freq: Once | INTRAMUSCULAR | Status: AC
  Administered 2021-09-15: 02:00:00 4 mg via INTRAVENOUS
  Filled 2021-09-15: qty 2

## 2021-09-15 MED ORDER — PROCHLORPERAZINE MALEATE 10 MG PO TABS: 10.0000 mg | ORAL_TABLET | Freq: Four times a day (QID) | ORAL | 0 refills | Status: DC | PRN

## 2021-09-15 NOTE — ED Notes (Signed)
This pt reports they do not want headache IV medication until their wife gets back and is at bedside. This RN attempted to call this pt's wife, Izora Gala, at this time. No answer

## 2021-09-15 NOTE — ED Triage Notes (Addendum)
Pt arrived via [facility] from home with reports of having vertigo and headaches, wife describes that pt had possible near syncopal episode. Per wife pt has been unable to stand which is new for him.  Pt did have some red wine with dinner and has vomited a few times.  Pt vomited on arrival to ED.

## 2021-09-15 NOTE — Discharge Instructions (Addendum)

## 2021-09-15 NOTE — ED Provider Notes (Signed)
[facility] Emergency Department Provider Note  ____________________________________________  Time seen: Approximately 4:40 AM  I have reviewed the triage vital signs and the nursing notes.   HISTORY  Chief Complaint Weakness   HPI Elijah MATTIOLI is a 80 y.o. 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 fib who presents for evaluation of vertigo and a headache.  History is gathered from patient and his wife's at bedside.  According to them patient had a normal day, they received visit from their kids for a late Christmas dinner.  He had 2 glasses of wine and they were getting ready to go to bed.  Patient walked in the bathroom and developed sudden onset of severe headache and vertigo. Vomited a few times. Patient reports that the headaches identical to his daily chronic headaches but more severe in intensity.  He denies ever having vertigo before.  Patient is currently on Plavix and aspirin.  Denies any trauma.  No slurred speech, no facial droop, no unilateral weakness or numbness, no diplopia, no dysarthria, no dysphagia.  According to patient and his wife he has been to dozens of specialists for his chronic headaches.  Has been put in several different medications and none of them helped.  Therefore patient lives with daily headaches and usually does not take anything for them at home.  He has no chest pain, no shortness of breath, no fever or chills.  Past Medical History:  Diagnosis Date   Arthritis    neck   Atrial fibrillation (HCC)    BPH (benign prostatic hyperplasia)    Chronic headache    GERD (gastroesophageal reflux disease)    History of skin cancer    History of skin cancer    OSA (obstructive sleep apnea)    CPAP    Patient Active Problem List   Diagnosis Date Noted   Arachnoid cyst 07/24/2020   Gallstones 07/24/2020   Hypertriglyceridemia 07/24/2020   ED  (erectile dysfunction) 07/24/2020   Thrombocytopenia ([place]) 07/24/2020   Closed extraarticular fracture of distal end of right radius 02/26/2020   Laceration of finger of right hand 02/26/2020   Radial shaft fracture 02/09/2020   Benign prostatic hyperplasia with lower urinary tract symptoms 06/18/2018   History of elevated PSA 06/18/2018   Atrial fibrillation with RVR ([place]) 09/18/2017   Chest pain 09/18/2017   GERD (gastroesophageal reflux disease) 09/18/2017   OSA (obstructive sleep apnea) 09/18/2017   B12 deficiency 12/05/2016   Essential hypertension 12/05/2016   Incomplete emptying of bladder 06/19/2014   Reduced libido 08/30/2013   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   Elevated prostate specific antigen (PSA) 06/08/2012   Urinary urgency 06/08/2012    Past Surgical History:  Procedure Laterality Date   CATARACT EXTRACTION W/PHACO Right 07/23/2019   Procedure: CATARACT EXTRACTION PHACO AND INTRAOCULAR LENS PLACEMENT ([place]) RIGHT;  Surgeon: Birder Robson, MD;  Location: [place];  Service: Ophthalmology;  Laterality: Right;  1:10 16.7% 11.84   CATARACT EXTRACTION W/PHACO Left 08/13/2019   Procedure: CATARACT EXTRACTION PHACO AND INTRAOCULAR LENS PLACEMENT ([facility]) LEFT 6.26  00:42.6;  Surgeon: Birder Robson, MD;  Location: [place];  Service: Ophthalmology;  Laterality: Left;  sleep apnea   HERNIA REPAIR      Prior to Admission medications   Medication Sig Start Date End Date Taking? Authorizing Provider  aspirin 81 MG EC tablet Take 81 mg by  mouth daily. 06/11/12   [provider]  atorvastatin (LIPITOR) 40 MG tablet  05/16/18   [provider]  glucosamine-chondroitin 500-400 MG tablet Take 2 tablets by mouth daily.    [provider]  meloxicam (MOBIC) 15 MG tablet Take 1 tablet (15 mg total) by mouth daily. 07/24/20   Edrick Kins, DPM  Multiple Vitamin (MULTI-VITAMINS) TABS Take by mouth.     [provider]  omega-3 acid ethyl esters (LOVAZA) 1 g capsule Take 1 g by mouth daily.    [provider]  omeprazole (PRILOSEC) 20 MG capsule Take 20 mg by mouth daily. 06/11/12   [provider]  tamsulosin (FLOMAX) 0.4 MG CAPS capsule Take 1 capsule (0.4 mg total) by mouth every other day. 11/02/18   Stoioff, Ronda Fairly, MD  Turmeric 500 MG CAPS Take by mouth.    [provider]  vitamin B-12 (CYANOCOBALAMIN) 500 MCG tablet Take 500 mcg by mouth daily.    [provider]    Allergies Cyclobenzaprine, Morphine, Oxycodone-acetaminophen, and Tadalafil  Family History  Problem Relation Age of Onset   Breast cancer Mother    Heart attack Father    Heart disease Father     Social History Social History   Tobacco Use   Smoking status: Never   Smokeless tobacco: Never  Vaping Use   Vaping Use: Never used  Substance Use Topics   Alcohol use: Yes    Alcohol/week: 7.0 standard drinks    Types: 7 Glasses of wine per week   Drug use: No    Review of Systems  Constitutional: Negative for fever. Eyes: Negative for visual changes. ENT: Negative for sore throat. Neck: No neck pain  Cardiovascular: Negative for chest pain. Respiratory: Negative for shortness of breath. Gastrointestinal: Negative for abdominal pain or diarrhea. + vomiting Genitourinary: Negative for dysuria. Musculoskeletal: Negative for back pain. Skin: Negative for rash. Neurological: Negative for weakness or numbness. + HA and vertigo Psych: No SI or HI  ____________________________________________   PHYSICAL EXAM:  VITAL SIGNS: ED Triage Vitals  Enc Vitals Group     BP 09/15/21 0126 119/69     Pulse Rate 09/15/21 0126 73     Resp 09/15/21 0126 18     Temp 09/15/21 0126 (!) 92.2 F (33.4 C)     Temp Source 09/15/21 0126 Rectal     SpO2 09/15/21 0126 97 %     Weight 09/15/21 0127 207 lb 14.3 oz (94.3 kg)     Height 09/15/21 0127 6\' 3"  (1.905 m)     Head  Circumference --      Peak Flow --      Pain Score 09/15/21 0120 8     Pain Loc --      Pain Edu? --      Excl. in [place]? --     Constitutional: Alert and oriented, laying with eyes closed, uncomfortable but in no apparent distress. HEENT:      Head: Normocephalic and atraumatic.         Eyes: Conjunctivae are normal. Sclera is non-icteric.       Mouth/Throat: Mucous membranes are moist.       Neck: Supple with no signs of meningismus. Cardiovascular: Regular rate and rhythm. No murmurs, gallops, or rubs. 2+ symmetrical distal pulses are present in all extremities. No JVD. Respiratory: Normal respiratory effort. Lungs are clear to auscultation bilaterally.  Gastrointestinal: Soft, non tender, and non distended with positive bowel sounds. No rebound  or guarding. Genitourinary: No CVA tenderness. Musculoskeletal:  No edema, cyanosis, or erythema of extremities. Neurologic: Normal speech and language. Face is symmetric. EOMI, PERRl, no nystagmus, Moving all extremities. No gross focal neurologic deficits are appreciated. Skin: Skin is warm, dry and intact. No rash noted. Psychiatric: Mood and affect are normal. Speech and behavior are normal.  ____________________________________________   LABS (all labs ordered are listed, but only abnormal results are displayed)  Labs Reviewed  BASIC METABOLIC PANEL - Abnormal; Notable for the following components:      Result Value   Potassium 2.8 (*)    Glucose, Bld 139 (*)    All other components within normal limits  CBC - Abnormal; Notable for the following components:   WBC 11.8 (*)    RBC 4.06 (*)    HCT 38.7 (*)    All other components within normal limits  URINALYSIS, ROUTINE W REFLEX MICROSCOPIC - Abnormal; Notable for the following components:   Color, Urine YELLOW (*)    APPearance CLEAR (*)    All other components within normal limits  RESP PANEL BY RT-PCR (FLU A&B, COVID) ARPGX2  ETHANOL  URINE DRUG SCREEN, QUALITATIVE ([facility]  ONLY)  TROPONIN I (HIGH SENSITIVITY)  TROPONIN I (HIGH SENSITIVITY)   ____________________________________________  EKG  ED ECG REPORT I, Rudene Re, the attending physician, personally viewed and interpreted this ECG.  Sinus rhythm with a rate of 76, prolonged QTC, no ST elevations or depression.  No significant changes when compared to prior ____________________________________________  RADIOLOGY  I have personally reviewed the images performed during this visit and I agree with the Radiologist's read.   Interpretation by Radiologist:  CT HEAD WO CONTRAST  Result Date: 09/15/2021 CLINICAL DATA:  Vertigo, headaches, near syncope EXAM: CT HEAD WITHOUT CONTRAST TECHNIQUE: Contiguous axial images were obtained from the base of the skull through the vertex without intravenous contrast. COMPARISON:  08/13/2020 FINDINGS: Brain: Stable arachnoid cyst within the left middle cranial fossa. No acute infarct or hemorrhage. Lateral ventricles and midline structures are unremarkable. No acute extra-axial fluid collections. No mass effect. Vascular: No hyperdense vessel or unexpected calcification. Skull: Normal. Negative for fracture or focal lesion. Sinuses/Orbits: No acute finding. Other: None. IMPRESSION: 1. Stable head CT, no acute intracranial process. Electronically Signed   By: Randa Ngo M.D.   On: 09/15/2021 02:13   DG Chest Portable 1 View  Result Date: 09/15/2021 CLINICAL DATA:  Hypoxia EXAM: PORTABLE CHEST 1 VIEW COMPARISON:  09/18/2017 FINDINGS: Shallow lung inflation with bibasilar atelectasis. No sizable pleural effusion. Cardiomediastinal contours are normal. IMPRESSION: Shallow lung inflation with bibasilar atelectasis. Electronically Signed   By: Ulyses Jarred M.D.   On: 09/15/2021 02:54     ____________________________________________   PROCEDURES  Procedure(s) performed:yes .1-3 Lead EKG Interpretation Performed by: Rudene Re, MD Authorized by:  Rudene Re, MD     Interpretation: non-specific     ECG rate assessment: normal     Rhythm: sinus rhythm     Ectopy: none     Conduction: abnormal     Critical Care performed:  None ____________________________________________   INITIAL IMPRESSION / ASSESSMENT AND PLAN / ED COURSE  80 y.o. 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 fib who presents for evaluation of sudden onset of vertigo and a headache, nausea and vomiting.  Patient looks uncomfortable but in no distress, he is neurologically intact otherwise.  Patient was taken stat  to CT scan to rule out brain bleed especially being on Plavix.  The CT was negative for that but does show a very large arachnoid cyst which is stable when compared to prior imaging.  Patient was started on IV fluids, meclizine and Zofran.  Presentation concerning for peripheral versus central vertigo.  EKG and troponin are negative for any cardiac dysrhythmias.  He does have mild hypokalemia with no EKG changes which is being supplemented IV.  Remainder of his labs did not show any acute abnormalities.  Alcohol level is negative.  Infectious work-up was started since patient was found to be hypothermic with rectal temperature of 95.73F on arrival.  Chest x-ray with no signs of pneumonia.  COVID and flu are pending.  UA and UDS pending.  If no alternative etiology is shown based on this evaluation plan to take patient for an MRI to rule out an acute stroke.  History is gathered from patient and his wife who is at bedside.  Old medical records reviewed including patient's last admission to the hospital in October 2022.  _________________________ 6:33 AM on 09/15/2021 ----------------------------------------- Patient is now maintaining a normal temperature.  Unclear why patient was hypothermic.  Chest x-ray with no signs of pneumonia, COVID and flu negative, UA negative.  Patient  feels improved.  Reports that the nausea has resolved.  Headache and vertigo are improved as well however he still feels slightly dizzy with movement of his head.  We will proceed with an MRI at this time.      _____________________________________________ Please note:  Patient was evaluated in Emergency Department today for the symptoms described in the history of present illness. Patient was evaluated in the context of the global COVID-19 pandemic, which necessitated consideration that the patient might be at risk for infection with the SARS-CoV-2 virus that causes COVID-19. Institutional protocols and algorithms that pertain to the evaluation of patients at risk for COVID-19 are in a state of rapid change based on information released by regulatory bodies including the CDC and federal and state organizations. These policies and algorithms were followed during the patient's care in the ED.  Some ED evaluations and interventions may be delayed as a result of limited staffing during the pandemic.   [place] Controlled Substance Database was reviewed by me. ____________________________________________   FINAL CLINICAL IMPRESSION(S) / ED DIAGNOSES   Final diagnoses:  Acute nonintractable headache, unspecified headache type  Vertigo      NEW MEDICATIONS STARTED DURING THIS VISIT:  ED Discharge Orders     None        Note:  This document was prepared using Dragon voice recognition software and may include unintentional dictation errors.    Rudene Re, MD 09/15/21 [phone]

## 2021-09-15 NOTE — ED Provider Notes (Signed)
----------------------------------------- °  7:04 AM on 09/15/2021 -----------------------------------------  Blood pressure 115/74, pulse 72, temperature (!) 97.4 F (36.3 C), temperature source Oral, resp. rate 12, height 6\' 3"  (1.905 m), weight 94.3 kg, SpO2 96 %.  Assuming care from Dr. Alfred Levins.  In short, Elijah Nelson is a 80 y.o. male with a chief complaint of Weakness .  Refer to the original H&P for additional details.  The current plan of care is to follow-up MR brain for dizziness and headache, ambulate afterward.  ----------------------------------------- 2:01 PM on 09/15/2021 ----------------------------------------- MRI brain is negative for acute process, does redemonstrate known arachnoid cyst.  Patient continues to complain of significant headache following MRI, does state dizziness is improved.  He was treated with migraine cocktail with improvement in headache, is now appropriate for discharge home with outpatient follow-up.  He was provided with referral to neurosurgery for potential intervention on arachnoid cyst, also counseled to follow-up with neurology.  He was counseled to return to the ED for new worsening symptoms, patient and wife agree with plan.    Blake Divine, MD 09/15/21 [phone]

## 2021-09-15 NOTE — ED Notes (Signed)
Pt taken to MRI  

## 2022-08-29 IMAGING — DX DG CHEST 1V PORT
2 series · 2 of 2 positions shown · non-contrast
Comparison: 09/18/2017

CLINICAL DATA: Hypoxia

EXAM:
PORTABLE CHEST 1 VIEW

[chest ap (1 of 2)]
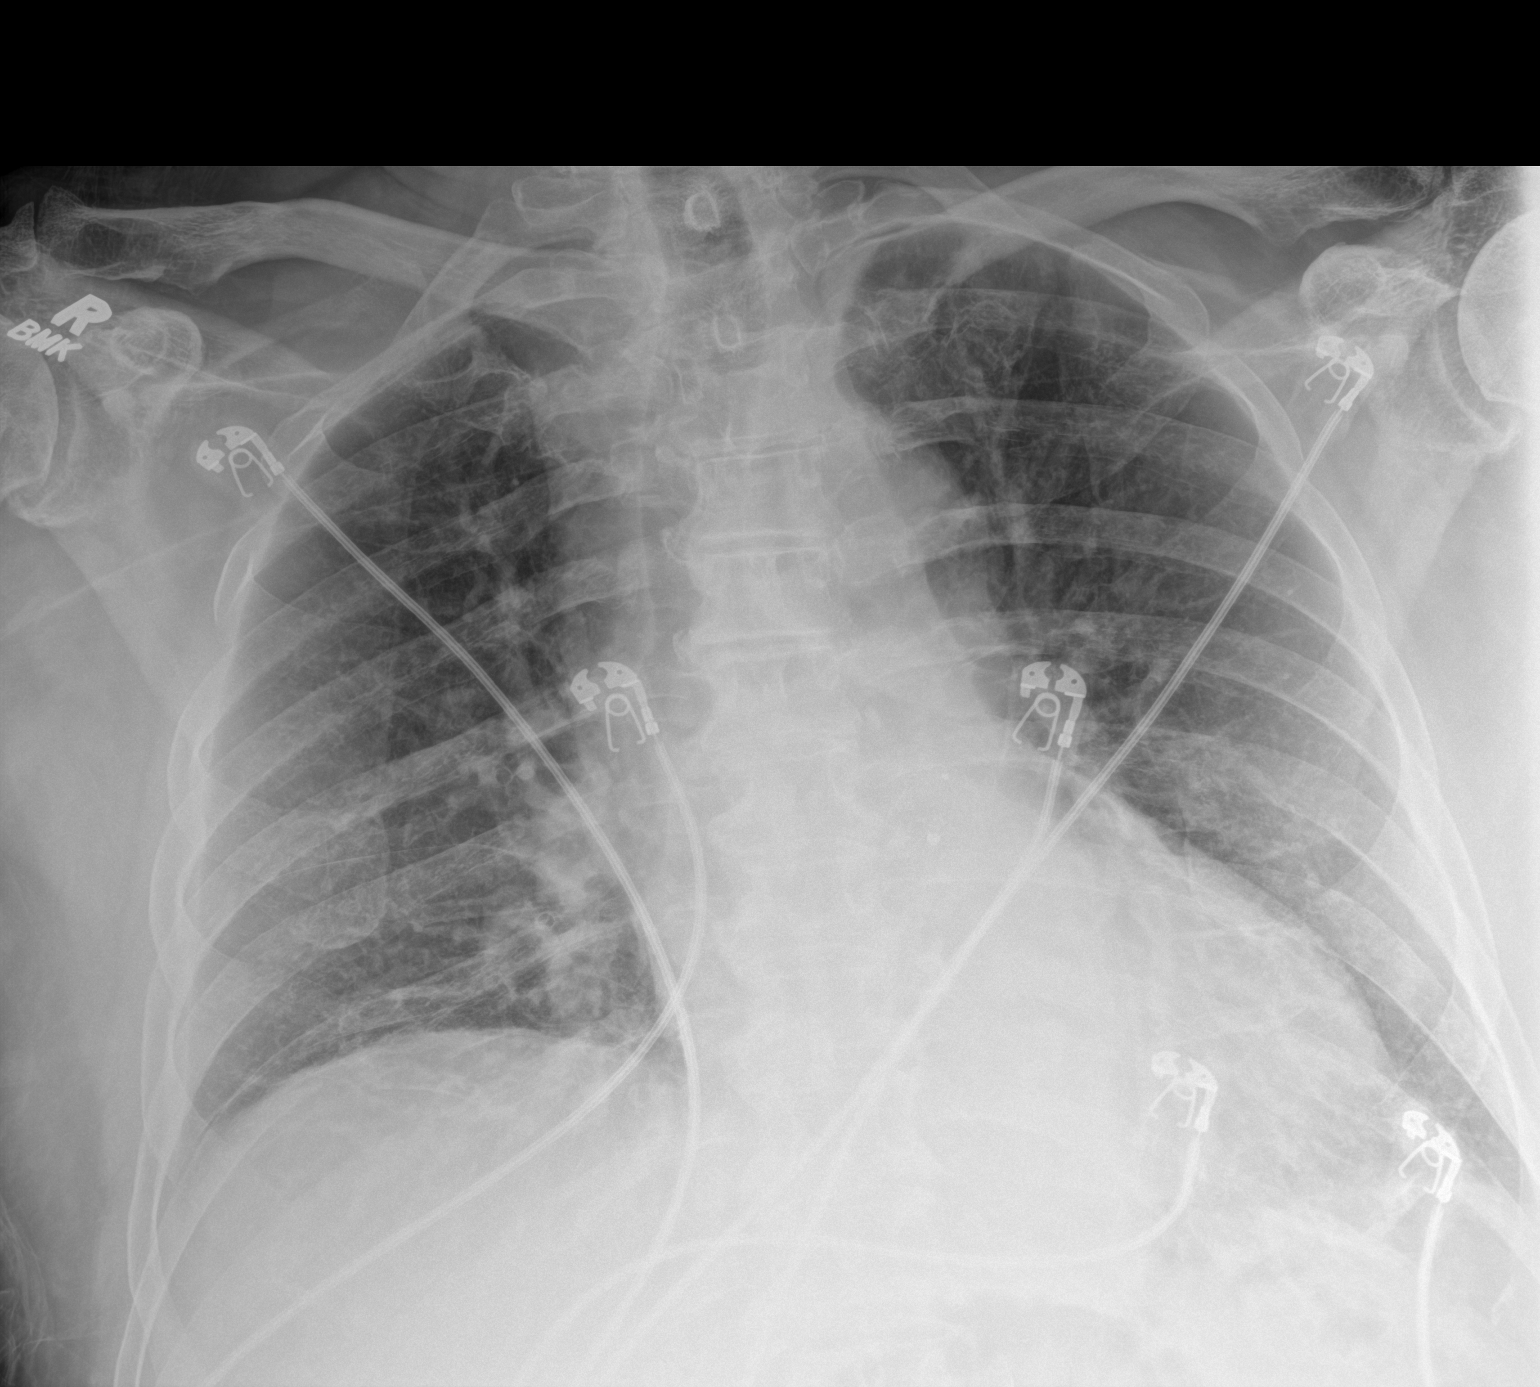

[chest ap (2 of 2)]
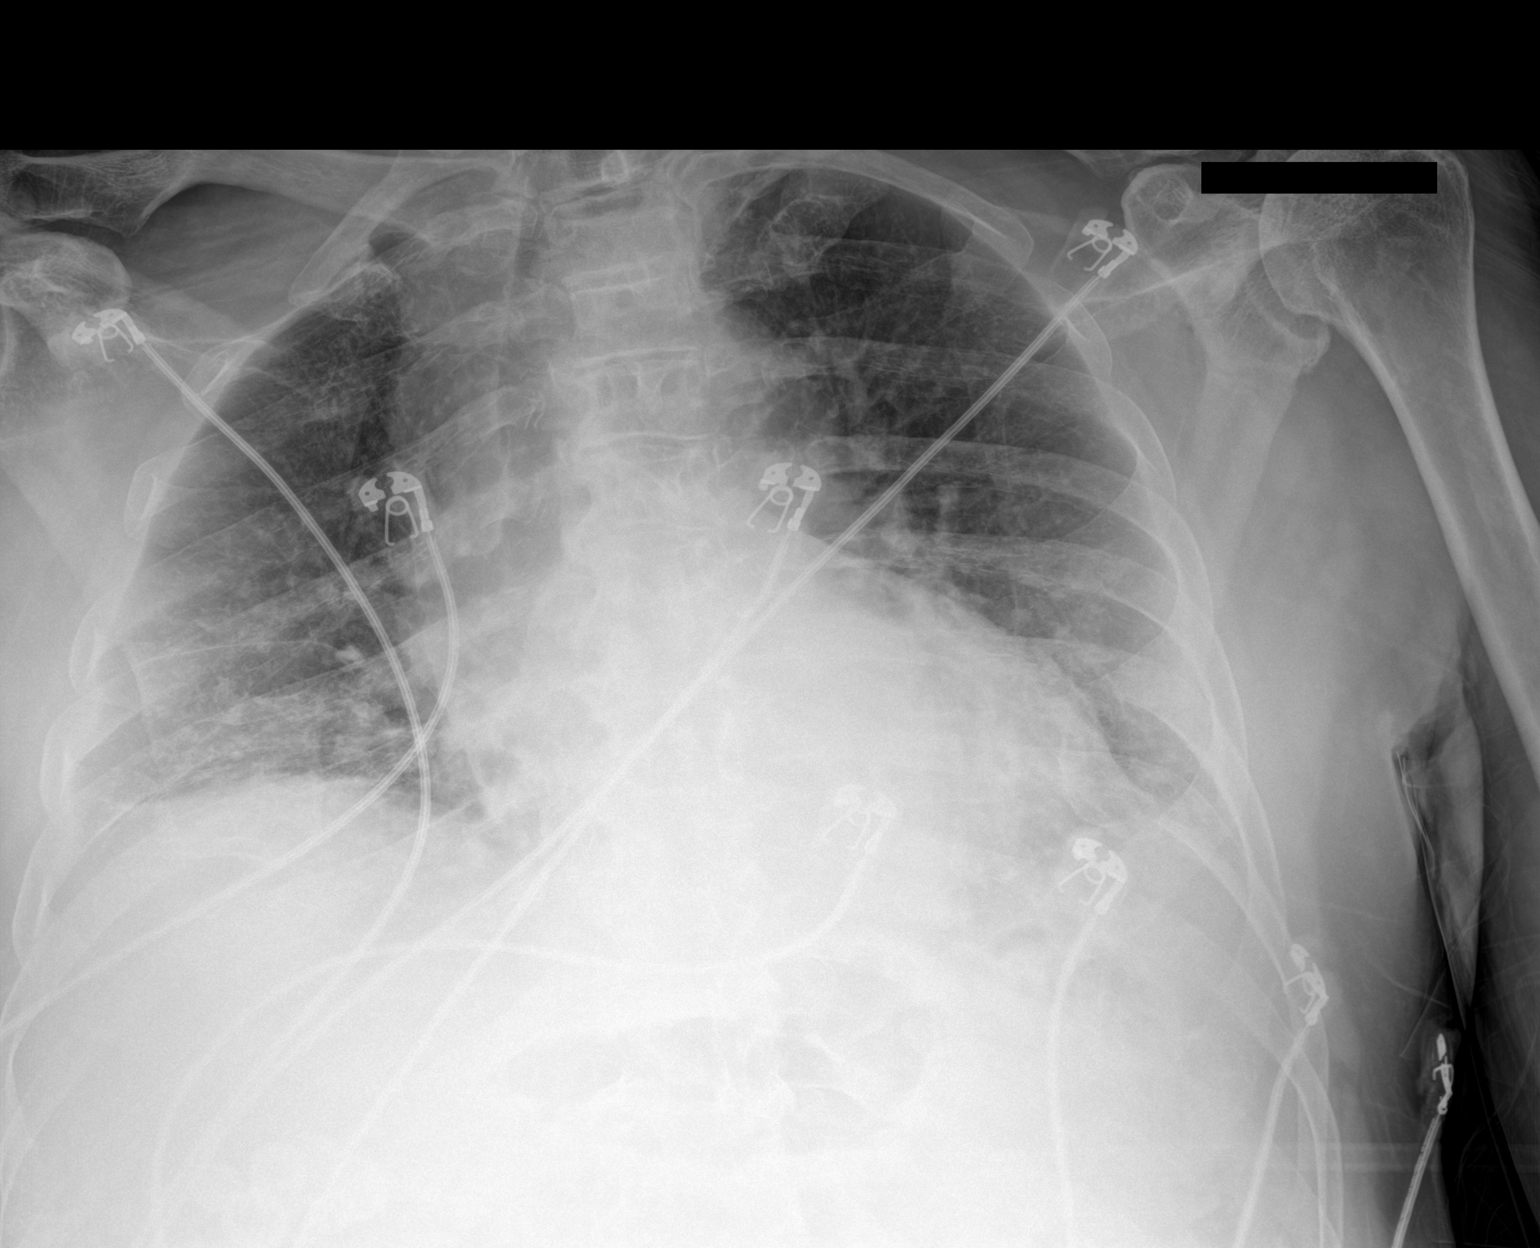

[2 of 2 positions shown; findings below may reference images not displayed]

FINDINGS: Shallow lung inflation with bibasilar atelectasis. No sizable
pleural effusion. Cardiomediastinal contours are normal.
IMPRESSION: Shallow lung inflation with bibasilar atelectasis.

## 2023-01-12 ENCOUNTER — Ambulatory Visit: Payer: Self-pay | Admitting: Surgery

## 2023-01-12 ENCOUNTER — Encounter
Admission: RE | Admit: 2023-01-12 | Discharge: 2023-01-12 | Disposition: A | Discharge status: Home / Self Care | Payer: Medicare Other | Source: Ambulatory Visit | Attending: Surgery | Admitting: Surgery

## 2023-01-12 ENCOUNTER — Other Ambulatory Visit: Payer: Self-pay

## 2023-01-12 VITALS — Ht 75.0 in | Wt 200.0 lb

## 2023-01-12 DIAGNOSIS — G4733 Obstructive sleep apnea (adult) (pediatric): Secondary | ICD-10-CM

## 2023-01-12 DIAGNOSIS — Z79899 Other long term (current) drug therapy: Secondary | ICD-10-CM

## 2023-01-12 DIAGNOSIS — I10 Essential (primary) hypertension: Secondary | ICD-10-CM

## 2023-01-12 DIAGNOSIS — E781 Pure hyperglyceridemia: Secondary | ICD-10-CM

## 2023-01-12 DIAGNOSIS — I4891 Unspecified atrial fibrillation: Secondary | ICD-10-CM

## 2023-01-12 DIAGNOSIS — D696 Thrombocytopenia, unspecified: Secondary | ICD-10-CM

## 2023-01-12 HISTORY — DX: Essential (primary) hypertension: I10

## 2023-01-12 NOTE — Patient Instructions (Signed)
Your procedure is scheduled on: Friday 01/20/23 To find out your arrival time, please call [phone] between 1PM - 3PM on:   Thursday 01/19/23 Report to the Registration Desk on the 1st floor of the [facility]. Valet parking is available.  If your arrival time is 6:00 am, do not arrive before that time as the [facility] entrance doors do not open until 6:00 am.  REMEMBER: Instructions that are not followed completely may result in serious medical risk, up to and including death; or upon the discretion of your surgeon and anesthesiologist your surgery may need to be rescheduled.  Do not eat food after midnight the night before surgery.  No gum chewing or hard candies.  You may however, drink CLEAR liquids up to 2 hours before you are scheduled to arrive for your surgery. Do not drink anything within 2 hours of your scheduled arrival time.  Clear liquids include: - water  - apple juice without pulp - gatorade (not RED colors) - black coffee or tea (Do NOT add milk or creamers to the coffee or tea) Do NOT drink anything that is not on this list.  Type 1 and Type 2 diabetics should only drink water.  One week prior to surgery: Stop Anti-inflammatories (NSAIDS) such as Advil, Aleve, Ibuprofen, Motrin, Naproxen, Naprosyn and  Excedrin, Goody's Powder, BC Powder. You may however, continue to take Tylenol if needed for pain up until the day of surgery.  Stop ANY OVER THE COUNTER supplements or vitamins until after surgery.  Continue taking all prescribed medications.   Follow recommendations from Cardiologist or PCP regarding stopping blood thinners. Hold Aspirin for 5 days prior to your surgery as instructed  TAKE ONLY THESE MEDICATIONS THE MORNING OF SURGERY WITH A SIP OF WATER:  omeprazole (PRILOSEC) 20 MG capsule Antacid (take one the night before and one on the morning of surgery - helps to prevent nausea after surgery.) tamsulosin (FLOMAX) 0.4 MG CAPS capsule   No Alcohol  for 24 hours before or after surgery.  No Smoking including e-cigarettes for 24 hours before surgery.  No chewable tobacco products for at least 6 hours before surgery.  No nicotine patches on the day of surgery.  Do not use any "recreational" drugs for at least a week (preferably 2 weeks) before your surgery.  Please be advised that the combination of cocaine and anesthesia may have negative outcomes, up to and including death. If you test positive for cocaine, your surgery will be cancelled.  On the morning of surgery brush your teeth with toothpaste and water, you may rinse your mouth with mouthwash if you wish. Do not swallow any toothpaste or mouthwash.  Use CHG Soap or wipes as directed on instruction sheet.  Do not wear lotions, powders, or perfumes.   Do not shave body hair from the neck down 48 hours before surgery.  Wear comfortable clothing (specific to your surgery type) to the hospital.  Do not wear jewelry, make-up, hairpins, clips or nail polish.  Contact lenses, hearing aids and dentures may not be worn into surgery.  Bring your C-PAP to the hospital in case you may have to spend the night.   Do not bring valuables to the hospital. [facility] is not responsible for any missing/lost belongings or valuables.   Notify your doctor if there is any change in your medical condition (cold, fever, infection).  If you are being discharged the day of surgery, you will not be allowed to drive home. You  will need a responsible individual to drive you home and stay with you for 24 hours after surgery.   If you are taking public transportation, you will need to have a responsible individual with you.  If you are being admitted to the hospital overnight, leave your suitcase in the car. After surgery it may be brought to your room.  In case of increased patient census, it may be necessary for you, the patient, to continue your postoperative care in the Same Day Surgery  department.  After surgery, you can help prevent lung complications by doing breathing exercises.  Take deep breaths and cough every 1-2 hours. Your doctor may order a device called an Incentive Spirometer to help you take deep breaths. When coughing or sneezing, hold a pillow firmly against your incision with both hands. This is called "splinting." Doing this helps protect your incision. It also decreases belly discomfort.  Surgery Visitation Policy:  Patients undergoing a surgery or procedure may have two family members or support persons with them as long as the person is not COVID-19 positive or experiencing its symptoms.   Inpatient Visitation:    Visiting hours are 7 a.m. to 8 p.m. Up to four visitors are allowed at one time in a patient room. The visitors may rotate out with other people during the day. One designated support person (adult) may remain overnight.  Please call the Pre-admissions Testing Dept. at [phone] if you have any questions about these instructions.     Preparing for Surgery with CHLORHEXIDINE GLUCONATE (CHG) Soap  Chlorhexidine Gluconate (CHG) Soap  o An antiseptic cleaner that kills germs and bonds with the skin to continue killing germs even after washing  o Used for showering the night before surgery and morning of surgery  Before surgery, you can play an important role by reducing the number of germs on your skin.  CHG (Chlorhexidine gluconate) soap is an antiseptic cleanser which kills germs and bonds with the skin to continue killing germs even after washing.  Please do not use if you have an allergy to CHG or antibacterial soaps. If your skin becomes reddened/irritated stop using the CHG.  1. Shower the NIGHT BEFORE SURGERY and the MORNING OF SURGERY with CHG soap.  2. If you choose to wash your hair, wash your hair first as usual with your normal shampoo.  3. After shampooing, rinse your hair and body thoroughly to remove the  shampoo.  4. Use CHG as you would any other liquid soap. You can apply CHG directly to the skin and wash gently with a scrungie or a clean washcloth.  5. Apply the CHG soap to your body only from the neck down. Do not use on open wounds or open sores. Avoid contact with your eyes, ears, mouth, and genitals (private parts). Wash face and genitals (private parts) with your normal soap.  6. Wash thoroughly, paying special attention to the area where your surgery will be performed.  7. Thoroughly rinse your body with warm water.  8. Do not shower/wash with your normal soap after using and rinsing off the CHG soap.  9. Pat yourself dry with a clean towel.  10. Wear clean pajamas to bed the night before surgery.  12. Place clean sheets on your bed the night of your first shower and do not sleep with pets.  13. Shower again with the CHG soap on the day of surgery prior to arriving at the hospital.  14. Do not apply any deodorants/lotions/powders.  15. Please wear clean clothes to the hospital.

## 2023-01-12 NOTE — H&P (Signed)
Subjective:  CC: Unilateral recurrent inguinal hernia without obstruction or gangrene [K40.91]  HPI: Elijah Nelson is a 82 y.o. male who was referred by Sallee Provencal, MD for evaluation of above. Symptoms were first noted a few days ago. Asymptomatic, reducible. 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  Past Surgical History: Past Surgical History: Procedure Laterality Date HERNIA REPAIR 1997 COLONOSCOPY 07/04/2005 HERNIA REPAIR 03/2009 left inguinal, Dr. Katrinka Blazing Neck lesion resection 10/2011 Dr. Jenne Campus; pathology consistent with lipoma COLONOSCOPY 02/20/2012 diverticulosis EGD 02/20/2012 07/04/2005. Hiatus hernia. No repeat ([place]) CLOSED REDUCTION ULNAR SHAFT FRACTURE W/O MANIPULATION Right 02/09/2020 Procedure: OPEN TREATMENT RADIAL SHAFT FRACTURE, INCL ORIF, WHEN PERFORMED, AND OPEN TREATMENT DISTAL RADIOULNAR JOINT DISLOCATION, INCL ORIF, WHEN PERFORMED, INCL REPAIR OF TRIANGULAR FIBROCARTILAGE COMPLEX; Surgeon: Lurlean Leyden, MD; Location: [facility] OR; Service: Orthopedics; Laterality: Right; REPAIR BLEPHAROPTOSIS W/LEVATOR MUSCLE RESECTION EXTERNAL APPROACH Bilateral 10/06/2020 Procedure: BILATERAL UPPER EYELID PTOSIS REPAIR WITH REMOVAL OF EXCESS SKIN WITH BILATERAL LOWER EYELID ECTROPION REPAIR AND BROW PTOSIS REPAIR BOTH EYES; Surgeon: Elmarie Shiley, MD; Location: [facility] OR; Service: Ophthalmology; Laterality: Bilateral; REPAIR BROW PTOSIS Bilateral 10/06/2020 Procedure: REPAIR OF BROW PTOSIS (SUPRACILIARY, MID-FOREHEAD OR CORONAL APPROACH);  Surgeon: Elmarie Shiley, MD; Location: [facility] OR; Service: Ophthalmology; Laterality: Bilateral; BLEPHAROPLASTY UPPER EYELID Bilateral 10/06/2020 Procedure: BLEPHAROPLASTY, UPPER EYELID; WITH EXCESSIVE SKIN WEIGHTING DOWN LID; Surgeon: Elmarie Shiley, MD; Location: [facility] OR; Service: Ophthalmology; Laterality: Bilateral; REPAIR ECTROPION EXTENSIVE Bilateral 10/06/2020 Procedure: REPAIR OF ECTROPION; EXTENSIVE (EG, TARSAL STRIP OPERATIONS); Surgeon: Elmarie Shiley, MD; Location: [facility] OR; Service: Ophthalmology; Laterality: Bilateral; WISDOM TEETH  Family History: family history includes Breast cancer in his mother; Heart disease in his father; Myocardial Infarction (Heart attack) in his father; No Known Problems in his brother.  Social History: reports that he has never smoked. He has never used smokeless tobacco. He reports current alcohol use of about 9.0 standard drinks of alcohol per week. He reports that he does not use drugs.  Current Medications: has a current medication list which includes the following prescription(s): acetaminophen, aspirin, atorvastatin, chlorthalidone, cyanocobalamin, glucosamine/chondro su a/c/mn, multivitamin, omeprazole, potassium chloride, and tamsulosin.  Allergies: Allergies as of 12/27/2022 - Reviewed 12/27/2022 Allergen Reaction Noted Cialis [tadalafil] Headache 05/22/2014 Cyclobenzaprine Headache 05/22/2014 Morphine Other (See Comments) 06/05/2015 Oxycodone-acetaminophen Headache 05/22/2014  ROS: A 15 point review of systems was performed and pertinent positives and negatives noted in HPI  Objective:   BP 126/82  Pulse 72  Ht 188 cm ( )  Wt 92.5 kg (204 lb)  BMI 26.19 kg/m  Constitutional : Alert, cooperative, no distress Lymphatics/Throat: Supple, no lymphadenopathy Respiratory: clear to auscultation bilaterally Cardiovascular: regular rate and rhythm Gastrointestinal: soft, non-tender; bowel sounds normal; no masses, no  organomegaly. inguinal hernia noted. 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 Unilateral recurrent inguinal hernia without obstruction or gangrene [K40.91] Discussed the risk of surgery including recurrence, which can be up to 50% in the case of incisional or complex hernias, possible use of prosthetic materials (mesh) and the  increased risk of mesh infxn if used, bleeding, chronic pain, post-op infxn, post-op SBO or ileus, and possible re-operation to address said risks. The risks of general anesthetic, if used, includes MI, CVA, sudden death or even reaction to anesthetic medications also discussed. Alternatives include continued observation. Benefits include possible symptom relief, prevention of incarceration, strangulation, enlargement in size over time, and the risk of emergency surgery in the face of strangulation.  Typical post-op recovery time of 3-5 days with 2 weeks of activity restrictions were also discussed.  ED return precautions given for sudden increase in pain, size of hernia with accompanying fever, nausea, and/or vomiting.  The patient verbalized understanding and all questions were answered to the patient's satisfaction.  2. Patient has elected to proceed with surgical treatment. Procedure will be scheduled. LEFT, open due to recurrence from previous lap repair  labs/images/medications/previous chart entries reviewed personally and relevant changes/updates noted above.   Electronically signed by Sung Amabile, DO at 12/27/2022 8:37 AM EDT

## 2023-01-12 NOTE — H&P (View-Only) (Signed)
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 male who was referred by Bert Jack Klein III, MD for evaluation of above. Symptoms were first noted a few days ago. Asymptomatic, reducible. 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  Past Surgical History: Past Surgical History: Procedure Laterality Date HERNIA REPAIR 1997 COLONOSCOPY 07/04/2005 HERNIA REPAIR 03/2009 left inguinal, Dr. Smith Neck lesion resection 10/2011 Dr. McQueen; pathology consistent with lipoma COLONOSCOPY 02/20/2012 diverticulosis EGD 02/20/2012 07/04/2005. Hiatus hernia. No repeat ([place]) CLOSED REDUCTION ULNAR SHAFT FRACTURE W/O MANIPULATION Right 02/09/2020 Procedure: OPEN TREATMENT RADIAL SHAFT FRACTURE, INCL ORIF, WHEN PERFORMED, AND OPEN TREATMENT DISTAL RADIOULNAR JOINT DISLOCATION, INCL ORIF, WHEN PERFORMED, INCL REPAIR OF TRIANGULAR FIBROCARTILAGE COMPLEX; Surgeon: Reilly, Rachel Mary, MD; Location: [facility] OR; Service: Orthopedics; Laterality: Right; REPAIR BLEPHAROPTOSIS W/LEVATOR MUSCLE RESECTION EXTERNAL APPROACH Bilateral 10/06/2020 Procedure: BILATERAL UPPER EYELID PTOSIS REPAIR WITH REMOVAL OF EXCESS SKIN WITH BILATERAL LOWER EYELID ECTROPION REPAIR AND BROW PTOSIS REPAIR BOTH EYES; Surgeon: Fowler, Amy Marie, MD; Location: [facility] OR; Service: Ophthalmology; Laterality: Bilateral; REPAIR BROW PTOSIS Bilateral 10/06/2020 Procedure: REPAIR OF BROW PTOSIS (SUPRACILIARY, MID-FOREHEAD OR CORONAL APPROACH);  Surgeon: Fowler, Amy Marie, MD; Location: [facility] OR; Service: Ophthalmology; Laterality: Bilateral; BLEPHAROPLASTY UPPER EYELID Bilateral 10/06/2020 Procedure: BLEPHAROPLASTY, UPPER EYELID; WITH EXCESSIVE SKIN WEIGHTING DOWN LID; Surgeon: Fowler, Amy Marie, MD; Location: [facility] OR; Service: Ophthalmology; Laterality: Bilateral; REPAIR ECTROPION EXTENSIVE Bilateral 10/06/2020 Procedure: REPAIR OF ECTROPION; EXTENSIVE (EG, TARSAL STRIP OPERATIONS); Surgeon: Fowler, Amy Marie, MD; Location: [facility] OR; Service: Ophthalmology; Laterality: Bilateral; WISDOM TEETH  Family History: family history includes Breast cancer in his mother; Heart disease in his father; Myocardial Infarction (Heart attack) in his father; No Known Problems in his brother.  Social History: reports that he has never smoked. He has never used smokeless tobacco. He reports current alcohol use of about 9.0 standard drinks of alcohol per week. He reports that he does not use drugs.  Current Medications: has a current medication list which includes the following prescription(s): acetaminophen, aspirin, atorvastatin, chlorthalidone, cyanocobalamin, glucosamine/chondro su a/c/mn, multivitamin, omeprazole, potassium chloride, and tamsulosin.  Allergies: Allergies as of 12/27/2022 - Reviewed 12/27/2022 Allergen Reaction Noted Cialis [tadalafil] Headache 05/22/2014 Cyclobenzaprine Headache 05/22/2014 Morphine Other (See Comments) 06/05/2015 Oxycodone-acetaminophen Headache 05/22/2014  ROS: A 15 point review of systems was performed and pertinent positives and negatives noted in HPI  Objective:   BP 126/82  Pulse 72  Ht 188 cm (6' 2")  Wt 92.5 kg (204 lb)  BMI 26.19 kg/m  Constitutional : Alert, cooperative, no distress Lymphatics/Throat: Supple, no lymphadenopathy Respiratory: clear to auscultation bilaterally Cardiovascular: regular rate and rhythm Gastrointestinal: soft, non-tender; bowel sounds normal; no masses, no  organomegaly. inguinal hernia noted. 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 Unilateral recurrent inguinal hernia without obstruction or gangrene [K40.91] Discussed the risk of surgery including recurrence, which can be up to 50% in the case of incisional or complex hernias, possible use of prosthetic materials (mesh) and the   increased risk of mesh infxn if used, bleeding, chronic pain, post-op infxn, post-op SBO or ileus, and possible re-operation to address said risks. The risks of general anesthetic, if used, includes MI, CVA, sudden death or even reaction to anesthetic medications also discussed. Alternatives include continued observation. Benefits include possible symptom relief, prevention of incarceration, strangulation, enlargement in size over time, and the risk of emergency surgery in the face of strangulation.  Typical post-op recovery time of 3-5 days with 2 weeks of activity restrictions were also discussed.  ED return precautions given for sudden increase in pain, size of hernia with accompanying fever, nausea, and/or vomiting.  The patient verbalized understanding and all questions were answered to the patient's satisfaction.  2. Patient has elected to proceed with surgical treatment. Procedure will be scheduled. LEFT, open due to recurrence from previous lap repair  labs/images/medications/previous chart entries reviewed personally and relevant changes/updates noted above.   Electronically signed by Keyden Pavlov, DO at 12/27/2022 8:37 AM EDT  

## 2023-01-13 ENCOUNTER — Encounter: Payer: Self-pay | Admitting: Urgent Care

## 2023-01-13 ENCOUNTER — Encounter
Admission: RE | Admit: 2023-01-13 | Discharge: 2023-01-13 | Disposition: A | Discharge status: Home / Self Care | Payer: Medicare Other | Source: Ambulatory Visit | Attending: Surgery | Admitting: Surgery

## 2023-01-13 DIAGNOSIS — Z01818 Encounter for other preprocedural examination: Secondary | ICD-10-CM | POA: Diagnosis present

## 2023-01-13 DIAGNOSIS — E781 Pure hyperglyceridemia: Secondary | ICD-10-CM | POA: Diagnosis not present

## 2023-01-13 DIAGNOSIS — I4891 Unspecified atrial fibrillation: Secondary | ICD-10-CM | POA: Diagnosis not present

## 2023-01-13 DIAGNOSIS — I10 Essential (primary) hypertension: Secondary | ICD-10-CM | POA: Insufficient documentation

## 2023-01-13 DIAGNOSIS — G4733 Obstructive sleep apnea (adult) (pediatric): Secondary | ICD-10-CM | POA: Diagnosis not present

## 2023-01-13 DIAGNOSIS — D696 Thrombocytopenia, unspecified: Secondary | ICD-10-CM | POA: Diagnosis not present

## 2023-01-13 DIAGNOSIS — Z79899 Other long term (current) drug therapy: Secondary | ICD-10-CM | POA: Diagnosis not present

## 2023-01-13 LAB — BASIC METABOLIC PANEL
Anion gap: 8 (ref 5–15)
BUN: 21 mg/dL (ref 8–23)
CO2: 28 mmol/L (ref 22–32)
Calcium: 9.3 mg/dL (ref 8.9–10.3)
Chloride: 101 mmol/L (ref 98–111)
Creatinine, Ser: 0.88 mg/dL (ref 0.61–1.24)
GFR, Estimated: >60 mL/min (ref >60)
Glucose, Bld: 92 mg/dL (ref 70–99)
Potassium: 3.4 mmol/L — ABNORMAL LOW (ref 3.5–5.1)
Sodium: 137 mmol/L (ref 135–145)

## 2023-01-13 LAB — CBC
HCT: 42.6 % (ref 39.0–52.0)
Hemoglobin: 14.2 g/dL (ref 13.0–17.0)
MCH: 32.4 pg (ref 26.0–34.0)
MCHC: 33.3 g/dL (ref 30.0–36.0)
MCV: 97.3 fL (ref 80.0–100.0)
Platelets: 158 10*3/uL (ref 150–400)
RBC: 4.38 MIL/uL (ref 4.22–5.81)
RDW: 13.8 % (ref 11.5–15.5)
WBC: 5.6 10*3/uL (ref 4.0–10.5)
nRBC: 0 % (ref 0.0–0.2)

## 2023-01-17 ENCOUNTER — Encounter: Payer: Self-pay | Admitting: Surgery

## 2023-01-17 NOTE — Progress Notes (Incomplete)
Perioperative / Anesthesia Services  Pre-Admission Testing Clinical Review / Preoperative Anesthesia Consult  Date: 01/18/23  Patient Demographics:  Name: Elijah Nelson DOB:   [birth year] MRN:   [id number]  Planned Surgical Procedure(s):    Case: [id number] Date/Time: 01/20/23 0919   Procedure: HERNIA REPAIR INGUINAL ADULT (Inguinal)   Anesthesia type: General   Pre-op diagnosis: unilateral recurernt inguinal hernia w/o obstruction or gangrene K40.91   Location: [facility] OR ROOM 07 / [facility]   Surgeons: Sung Amabile, DO     NOTE: Available PAT nursing documentation and vital signs have been reviewed. Clinical nursing staff has updated patient's PMH/PSHx, current medication list, and drug allergies/intolerances to ensure comprehensive history available to assist in medical decision making as it pertains to the aforementioned surgical procedure and anticipated anesthetic course. Extensive review of available clinical information personally performed. [facility] and PSHx updated with any diagnoses/procedures that  may have been inadvertently omitted during his intake with the pre-admission testing department's nursing staff.  Clinical Discussion:  Elijah Nelson is a 82 y.o. male who is submitted for pre-surgical anesthesia review and clearance prior to him undergoing the above procedure. Patient has never been a smoker. Pertinent PMH includes: CAD, atrial fibrillation, diastolic dysfunction, NSVT, ascending aorta dilatation, HTN, HLD, OSAH (requires nocturnal PAP therapy), GERD (on daily PPI), BPH, arachnoid cyst, inguinal hernia, OA, thrombocytopenia, BPH.  Patient is followed by cardiology Juliann Pares, MD). He was last seen in the cardiology clinic on 01/11/2023; notes reviewed. At the time of his clinic visit, patient doing well overall from a cardiovascular perspective.  Patient continued to experience episodes of paroxysmal atrial fibrillation triggered by  overexertion.  Episodes associated with his lightheadedness.  Patient denied any chest pain, shortness of breath, PND, orthopnea, palpitations, significant peripheral edema, weakness, fatigue, or presyncope/syncope. Patient with a past medical history significant for cardiovascular diagnoses. Documented physical exam was grossly benign, providing no evidence of acute exacerbation and/or decompensation of the patient's known cardiovascular conditions.  Myocardial perfusion imaging study performed on 10/06/2017 revealed a normal left ventricular systolic function with EF of 65%.  There were no significant regional wall motion abnormalities.  There was no evidence of stress-induced myocardial ischemia or arrhythmia; no scintigraphic evidence of scar.  Study determined to be normal and low risk overall.  Last TTE was performed on 06/18/2021 revealed a normal left ventricular systolic function with an EF of >55%.  There was mild left ventricular hypertrophy. Left ventricular diastolic Doppler parameters consistent with abnormal relaxation (G1DD). Mild mitral annular calcification and aortic valve sclerosis noted. Mild pan valvular regurgitation observed. All transvalvular gradients were noted to be normal providing no evidence suggestive of valvular stenosis.  Mild dilatation of the ascending aorta noted measuring 37 mm.  Patient with an atrial fibrillation diagnosis; CHA2DS2-VASc Score = 3 (age x 2, HTN).patient initially anticoagulated using apixaban, however this caused patient to experience significant headaches.  Discussed changing patient over to rivaroxaban, however he requested a more definitive treatment option.  Ultimately, patient underwent LAA closure (Watchman device) placement on 06/23/2022.  Rhythm currently being maintained without pharmacological intervention.  Patient is no longer on oral anticoagulation therapy.  Blood pressure well-managed at 124/80 mmHg on currently prescribed diuretic  (chlorthalidone) monotherapy.  Patient is on atorvastatin for his HLD diagnosis and ASCVD prevention.  Patient is not diabetic.  He does have an OSAH diagnosis, and is reportedly compliant with prescribed nocturnal PAP therapy.  Functional capacity limited by age and medical comorbidities.  However, with that being said, patient still felt to be able to achieve at least 4 METS of physical activity without experiencing any degree of significant angina/anginal equivalent symptoms.  No changes were made to his medication regimen.  Patient to follow-up with outpatient cardiology in 1 year or sooner if needed.  Elijah Nelson is scheduled for an elective INGUINAL HERNIA REPAIR on 01/20/2023 with Dr. Sung Amabile, DO. Given patient's past medical history significant for cardiovascular diagnoses, presurgical cardiac clearance was sought by the PAT team. Per cardiology, "this patient is optimized for surgery and may proceed with the planned procedural course with a LOW risk of significant perioperative cardiovascular complications".  In review of his medication reconciliation, it is noted that patient is currently on prescribed daily antithrombotic therapy. He has been instructed on recommendations for holding his daily low-dose ASA for 7 days prior to his procedure with plans to restart as soon as postoperative bleeding risk felt to be minimized by his attending surgeon. The patient has been instructed that his last dose of his ASA should be on 01/12/2023.  Patient denies previous perioperative complications with anesthesia in the past. In review of the available records, it is noted that patient underwent a general anesthetic course here at [facility] ([facility]) in 09/2021 without documented complications.      01/12/2023    1:59 PM 09/15/2021    2:00 PM 09/15/2021    1:00 PM  Vitals with BMI  Height 6\' 3"     Weight 200 lbs    BMI 25    Systolic  107 102  Diastolic  70 67   Pulse  78 72    Providers/Specialists:   NOTE: Primary physician provider listed below. Patient may have been seen by APP or partner within same practice.   PROVIDER ROLE / SPECIALTY LAST Michelle Nasuti, DO General Surgery (Surgeon) 12/27/2022  Lynnea Ferrier, MD Primary Care Provider 12/21/2022  Rudean Hitt, MD Cardiology 01/11/2023   Allergies:  Apixaban, Cyclobenzaprine, Morphine, Oxycodone-acetaminophen, and Tadalafil  Current Home Medications:   No current facility-administered medications for this encounter.    acetaminophen (TYLENOL) 325 MG tablet   aspirin 81 MG EC tablet   atorvastatin (LIPITOR) 80 MG tablet   chlorthalidone (HYGROTON) 25 MG tablet   glucosamine-chondroitin 500-400 MG tablet   meclizine (ANTIVERT) 25 MG tablet   meloxicam (MOBIC) 15 MG tablet   Multiple Vitamin (MULTI-VITAMINS) TABS   omega-3 acid ethyl esters (LOVAZA) 1 g capsule   omeprazole (PRILOSEC) 20 MG capsule   potassium chloride SA (KLOR-CON M) 20 MEQ tablet   prochlorperazine (COMPAZINE) 10 MG tablet   tamsulosin (FLOMAX) 0.4 MG CAPS capsule   vitamin B-12 (CYANOCOBALAMIN) 500 MCG tablet   History:   Past Medical History:  Diagnosis Date   Arachnoid cyst    a.) left temporal and anterior parietal lobes - has been evaluated by neurology - benign   Arthritis    Ascending aorta dilatation (HCC)    a.) TTE 06/18/2021: 37 mm   Atrial fibrillation (HCC)    a.) CHA2DS2VASc = 3 (age x2, HTN); b.) s/p LAA closure (Watchman) 06/23/2021; c.) rate/rhythm maintained without pharmacological intervention; no chronic anticoagulation   B12 deficiency    BPH (benign prostatic hyperplasia)    CAD (coronary artery disease)    Chronic headache    Diastolic dysfunction    a.) TTE 06/18/2021: EF >55%, mild LVH, mild MAC, mild AoV sclerosis, mild panvalvular regurg, G1DD  Elevated PSA    Erectile dysfunction    GERD (gastroesophageal reflux disease)    History of bilateral cataract  extraction 07/2019   History of skin cancer    HLD (hyperlipidemia)    Hypertension    Inguinal hernia    NSVT (nonsustained ventricular tachycardia) (HCC)    OSA on CPAP    Thrombocytopenia [facility])    Past Surgical History:  Procedure Laterality Date   CATARACT EXTRACTION W/PHACO Right 07/23/2019   Procedure: CATARACT EXTRACTION PHACO AND INTRAOCULAR LENS PLACEMENT (IOC) RIGHT;  Surgeon: Galen Manila, MD;  Location: [facility];  Service: Ophthalmology;  Laterality: Right;  1:10 16.7% 11.84   CATARACT EXTRACTION W/PHACO Left 08/13/2019   Procedure: CATARACT EXTRACTION PHACO AND INTRAOCULAR LENS PLACEMENT (IOC) LEFT 6.26  00:42.6;  Surgeon: Galen Manila, MD;  Location: [facility];  Service: Ophthalmology;  Laterality: Left;  sleep apnea   compund fracture right forearm     HERNIA REPAIR     LEFT ATRIAL APPENDAGE OCCLUSION  06/23/2021   Family History  Problem Relation Age of Onset   Breast cancer Mother    Heart attack Father    Heart disease Father    Social History   Tobacco Use   Smoking status: Never   Smokeless tobacco: Never  Vaping Use   Vaping Use: Never used  Substance Use Topics   Alcohol use: Yes    Alcohol/week: 7.0 standard drinks of alcohol    Types: 7 Glasses of wine per week   Drug use: No    Pertinent Clinical Results:  LABS:   No visits with results within 3 Day(s) from this visit.  Latest known visit with results is:  Hospital Outpatient Visit on 01/13/2023  Component Date Value Ref Range Status   Sodium 01/13/2023 137  135 - 145 mmol/L Final   Potassium 01/13/2023 3.4 (L)  3.5 - 5.1 mmol/L Final   Chloride 01/13/2023 101  98 - 111 mmol/L Final   CO2 01/13/2023 28  22 - 32 mmol/L Final   Glucose, Bld 01/13/2023 92  70 - 99 mg/dL Final   Glucose reference range applies only to samples taken after fasting for at least 8 hours.   BUN 01/13/2023 21  8 - 23 mg/dL Final   Creatinine, Ser 01/13/2023 0.88  0.61 - 1.24 mg/dL  Final   Calcium 40/98/1191 9.3  8.9 - 10.3 mg/dL Final   GFR, Estimated 01/13/2023 >60  >60 mL/min Final   Comment: (NOTE) Calculated using the CKD-EPI Creatinine Equation (2021)    Anion gap 01/13/2023 8  5 - 15 Final   Performed at [facility], [address] [place], [place] [address]   WBC 01/13/2023 5.6  4.0 - 10.5 K/uL Final   RBC 01/13/2023 4.38  4.22 - 5.81 MIL/uL Final   Hemoglobin 01/13/2023 14.2  13.0 - 17.0 g/dL Final   HCT 56/21/3086 42.6  39.0 - 52.0 % Final   MCV 01/13/2023 97.3  80.0 - 100.0 fL Final   MCH 01/13/2023 32.4  26.0 - 34.0 pg Final   MCHC 01/13/2023 33.3  30.0 - 36.0 g/dL Final   RDW 57/84/6962 13.8  11.5 - 15.5 % Final   Platelets 01/13/2023 158  150 - 400 K/uL Final   nRBC 01/13/2023 0.0  0.0 - 0.2 % Final   Performed at [facility], [address] [place], [place] [address]    ECG: Date: 01/13/2023 Time ECG obtained: 0906 AM Rate: 67 bpm Rhythm: normal sinus  Axis (leads I and aVF): Normal Intervals: PR 180 ms. QRS 108 ms. QTc 448 ms. ST segment and T wave changes: No evidence of acute ST segment elevation or depression Comparison: Similar to previous tracing obtained on 09/15/2021   IMAGING / PROCEDURES: TRANSESOPHAGEAL ECHOCARDIOGRAM performed on 10/13/2021 Well-seated watchman device without leak  No significant valvular dysfunction  No ASD/PFO noted  Glidescope used to intubate the esophagus with some difficulty.   MR BRAIN WO CONTRAST performed on 09/15/2021 No evidence of recent infarction, hemorrhage, or mass. Similar appearance of large left middle cranial fossa arachnoid cyst. Mild chronic microvascular ischemic changes.  CT PULMONARY VEIN PROTOCOL INCLUDING CTA CHEST performed on 06/22/2021 Pulmonary vein measurements: Right superior pulmonary vein origin (RSPV):22.2 X 16.2, 304, RUL, RML  Right inferior pulmonary vein origin (RIPV):21.3 X 14.1, 233, RLL  Left superior pulmonary vein origin (LSPV): 16.8 X  14.5, 194, LUL  Left inferior pulmonary vein origin (LIPV): 18.8 X 13.4, 203, LLL  Distance between RSPV and LSPV: 68.8 mm Left atrium volume: 119 mL           Bilateral pulmonary nodules measuring up to 7 mm. If the patient is at low risk for lung cancer, recommend follow-up CT at 6-12 months and  optional CT at 18-24 months.  If the patient is at high risk for lung  cancer, recommend follow-up CT at 6-12 months and again at 18-24 months to  ensure stability. (2017 Fleischner Guidelines)   TRANSTHORACIC ECHOCARDIOGRAM performed on 06/18/2021 Normal left ventricular systolic function with an EF of >55% No regional wall motion abnormalities Left ventricular diastolic Doppler parameters consistent with abnormal relaxation (G1DD). Normal right ventricular size and function Mild mitral annular calcification Mild pan valvular regurgitation Normal gradients; no valvular stenosis Mildly dilated ascending aorta measuring 3.7 cm Mild aortic valve sclerosis/calcification  MYOCARDIAL PERFUSION IMAGING STUDY (LEXISCAN) performed on 10/06/2017 Normal left ventricular systolic function with an EF of 65% No artifact Left ventricular cavity size normal No evidence of stress-induced myocardial ischemia or arrhythmia Normal low risk study  Impression and Plan:  Elijah Nelson has been referred for pre-anesthesia review and clearance prior to him undergoing the planned anesthetic and procedural courses. Available labs, pertinent testing, and imaging results were personally reviewed by me in preparation for upcoming operative/procedural course. [facility] medical record has been updated following extensive record review and patient interview with PAT staff.   This patient has been appropriately cleared by cardiology with an overall LOW risk of significant perioperative cardiovascular complications. Based on clinical review performed today (01/18/23), barring any significant acute changes in the patient's  overall condition, it is anticipated that he will be able to proceed with the planned surgical intervention. Any acute changes in clinical condition may necessitate his procedure being postponed and/or cancelled. Patient will meet with anesthesia team (MD and/or CRNA) on the day of his procedure for preoperative evaluation/assessment. Questions regarding anesthetic course will be fielded at that time.   Pre-surgical instructions were reviewed with the patient during his PAT appointment, and questions were fielded to satisfaction by PAT clinical staff. He has been instructed on which medications that he will need to hold prior to surgery, as well as the ones that have been deemed safe/appropriate to take of the day of his procedure. As part of the general education provided by PAT, patient made aware both verbally and in writing, that he would need to abstain from the use of any illegal substances during his perioperative course.  He was  advised that failure to follow the provided instructions could necessitate case cancellation or result serious perioperative complications up to and including death. Patient encouraged to contact PAT and/or his surgeon's office to discuss any questions or concerns that may arise prior to surgery; verbalized understanding.   Quentin Mulling, MSN, APRN, FNP-C, CEN [facility]  Peri-operative Services Nurse Practitioner Phone: [phone] Fax: [phone] 01/18/23 2:56 PM  NOTE: This note has been prepared using Dragon dictation software. Despite my best ability to proofread, there is always the potential that unintentional transcriptional errors may still occur from this process.

## 2023-01-20 ENCOUNTER — Encounter: Payer: Self-pay | Admitting: Surgery

## 2023-01-20 ENCOUNTER — Ambulatory Visit: Payer: Medicare Other | Admitting: Urgent Care

## 2023-01-20 ENCOUNTER — Other Ambulatory Visit: Payer: Self-pay

## 2023-01-20 ENCOUNTER — Encounter
Admission: RE | Disposition: A | Discharge status: Home / Self Care | Payer: Self-pay | Source: Ambulatory Visit | Attending: Surgery

## 2023-01-20 ENCOUNTER — Ambulatory Visit
Admission: RE | Admit: 2023-01-20 | Discharge: 2023-01-20 | Disposition: A | Discharge status: Home / Self Care | Payer: Medicare Other | Source: Ambulatory Visit | Attending: Surgery | Admitting: Surgery

## 2023-01-20 DIAGNOSIS — I2510 Atherosclerotic heart disease of native coronary artery without angina pectoris: Secondary | ICD-10-CM | POA: Insufficient documentation

## 2023-01-20 DIAGNOSIS — G4733 Obstructive sleep apnea (adult) (pediatric): Secondary | ICD-10-CM | POA: Diagnosis not present

## 2023-01-20 DIAGNOSIS — K219 Gastro-esophageal reflux disease without esophagitis: Secondary | ICD-10-CM | POA: Diagnosis not present

## 2023-01-20 DIAGNOSIS — K4091 Unilateral inguinal hernia, without obstruction or gangrene, recurrent: Secondary | ICD-10-CM | POA: Insufficient documentation

## 2023-01-20 DIAGNOSIS — I5032 Chronic diastolic (congestive) heart failure: Secondary | ICD-10-CM | POA: Diagnosis not present

## 2023-01-20 DIAGNOSIS — E785 Hyperlipidemia, unspecified: Secondary | ICD-10-CM | POA: Diagnosis not present

## 2023-01-20 DIAGNOSIS — D696 Thrombocytopenia, unspecified: Secondary | ICD-10-CM | POA: Diagnosis not present

## 2023-01-20 DIAGNOSIS — Z79899 Other long term (current) drug therapy: Secondary | ICD-10-CM | POA: Insufficient documentation

## 2023-01-20 DIAGNOSIS — I110 Hypertensive heart disease with heart failure: Secondary | ICD-10-CM | POA: Diagnosis not present

## 2023-01-20 DIAGNOSIS — I4891 Unspecified atrial fibrillation: Secondary | ICD-10-CM | POA: Insufficient documentation

## 2023-01-20 HISTORY — DX: Cerebral cysts: G93.0

## 2023-01-20 HISTORY — PX: INGUINAL HERNIA REPAIR: SHX194

## 2023-01-20 HISTORY — DX: Deficiency of other specified B group vitamins: E53.8

## 2023-01-20 HISTORY — DX: Unilateral inguinal hernia, without obstruction or gangrene, not specified as recurrent: K40.90

## 2023-01-20 HISTORY — DX: Thoracic aortic ectasia: I77.810

## 2023-01-20 HISTORY — DX: Other ventricular tachycardia: I47.29

## 2023-01-20 HISTORY — DX: Thrombocytopenia, unspecified: D69.6

## 2023-01-20 HISTORY — DX: Obstructive sleep apnea (adult) (pediatric): G47.33

## 2023-01-20 HISTORY — DX: Other ill-defined heart diseases: I51.89

## 2023-01-20 HISTORY — DX: Male erectile dysfunction, unspecified: N52.9

## 2023-01-20 HISTORY — DX: Elevated prostate specific antigen (PSA): R97.20

## 2023-01-20 HISTORY — DX: Hyperlipidemia, unspecified: E78.5

## 2023-01-20 HISTORY — DX: Atherosclerotic heart disease of native coronary artery without angina pectoris: I25.10

## 2023-01-20 SURGERY — REPAIR, HERNIA, INGUINAL, ADULT
Anesthesia: General | Site: Inguinal | Laterality: Left

## 2023-01-20 MED ORDER — TRAMADOL HCL 50 MG PO TABS: 50.0000 mg | ORAL_TABLET | Freq: Four times a day (QID) | ORAL | 0 refills | Status: AC | PRN

## 2023-01-20 MED ORDER — SEVOFLURANE IN SOLN
RESPIRATORY_TRACT | Status: AC
  Filled 2023-01-20: qty 250

## 2023-01-20 MED ORDER — ONDANSETRON HCL 4 MG/2ML IJ SOLN
INTRAMUSCULAR | Status: DC | PRN
  Administered 2023-01-20: 4 mg via INTRAVENOUS

## 2023-01-20 MED ORDER — EPHEDRINE 5 MG/ML INJ
INTRAVENOUS | Status: AC
  Filled 2023-01-20: qty 5

## 2023-01-20 MED ORDER — CHLORHEXIDINE GLUCONATE CLOTH 2 % EX PADS
6.0000 | MEDICATED_PAD | Freq: Once | CUTANEOUS | Status: AC
  Administered 2023-01-20: 6 via TOPICAL

## 2023-01-20 MED ORDER — EPHEDRINE SULFATE (PRESSORS) 50 MG/ML IJ SOLN
INTRAMUSCULAR | Status: DC | PRN
  Administered 2023-01-20: 10 mg via INTRAVENOUS

## 2023-01-20 MED ORDER — GABAPENTIN 300 MG PO CAPS
ORAL_CAPSULE | ORAL | Status: AC
  Filled 2023-01-20: qty 1

## 2023-01-20 MED ORDER — LIDOCAINE HCL (CARDIAC) PF 100 MG/5ML IV SOSY
PREFILLED_SYRINGE | INTRAVENOUS | Status: DC | PRN
  Administered 2023-01-20: 100 mg via INTRAVENOUS

## 2023-01-20 MED ORDER — BUPIVACAINE LIPOSOME 1.3 % IJ SUSP
INTRAMUSCULAR | Status: DC | PRN
  Administered 2023-01-20: 20 mL

## 2023-01-20 MED ORDER — BUPIVACAINE HCL (PF) 0.5 % IJ SOLN
INTRAMUSCULAR | Status: AC
  Filled 2023-01-20: qty 30

## 2023-01-20 MED ORDER — ACETAMINOPHEN 500 MG PO TABS
1000.0000 mg | ORAL_TABLET | ORAL | Status: AC
  Administered 2023-01-20: 1000 mg via ORAL

## 2023-01-20 MED ORDER — BUPIVACAINE-EPINEPHRINE (PF) 0.5% -1:200000 IJ SOLN
INTRAMUSCULAR | Status: DC | PRN
  Administered 2023-01-20: 11 mL

## 2023-01-20 MED ORDER — FENTANYL CITRATE (PF) 100 MCG/2ML IJ SOLN
INTRAMUSCULAR | Status: DC | PRN
  Administered 2023-01-20 (×2): 50 ug via INTRAVENOUS

## 2023-01-20 MED ORDER — KETOROLAC TROMETHAMINE 30 MG/ML IJ SOLN
INTRAMUSCULAR | Status: DC | PRN
  Administered 2023-01-20: 30 mg via INTRAVENOUS

## 2023-01-20 MED ORDER — EPINEPHRINE PF 1 MG/ML IJ SOLN
INTRAMUSCULAR | Status: AC
  Filled 2023-01-20: qty 1

## 2023-01-20 MED ORDER — PROPOFOL 10 MG/ML IV BOLUS
INTRAVENOUS | Status: DC | PRN
  Administered 2023-01-20: 150 mg via INTRAVENOUS

## 2023-01-20 MED ORDER — FENTANYL CITRATE (PF) 100 MCG/2ML IJ SOLN: 25.0000 ug | INTRAMUSCULAR | Status: DC | PRN

## 2023-01-20 MED ORDER — ORAL CARE MOUTH RINSE: 15.0000 mL | Freq: Once | OROMUCOSAL | Status: AC

## 2023-01-20 MED ORDER — OXYCODONE HCL 5 MG/5ML PO SOLN: 5.0000 mg | Freq: Once | ORAL | Status: DC | PRN

## 2023-01-20 MED ORDER — LIDOCAINE HCL (PF) 2 % IJ SOLN
INTRAMUSCULAR | Status: AC
  Filled 2023-01-20: qty 5

## 2023-01-20 MED ORDER — CEFAZOLIN SODIUM-DEXTROSE 2-4 GM/100ML-% IV SOLN
INTRAVENOUS | Status: AC
  Filled 2023-01-20: qty 100

## 2023-01-20 MED ORDER — GABAPENTIN 300 MG PO CAPS
300.0000 mg | ORAL_CAPSULE | ORAL | Status: AC
  Administered 2023-01-20: 300 mg via ORAL

## 2023-01-20 MED ORDER — KETOROLAC TROMETHAMINE 30 MG/ML IJ SOLN
INTRAMUSCULAR | Status: AC
  Filled 2023-01-20: qty 1

## 2023-01-20 MED ORDER — BUPIVACAINE LIPOSOME 1.3 % IJ SUSP
INTRAMUSCULAR | Status: AC
  Filled 2023-01-20: qty 20

## 2023-01-20 MED ORDER — SUGAMMADEX SODIUM 200 MG/2ML IV SOLN
INTRAVENOUS | Status: DC | PRN
  Administered 2023-01-20: 200 mg via INTRAVENOUS

## 2023-01-20 MED ORDER — ROCURONIUM BROMIDE 100 MG/10ML IV SOLN
INTRAVENOUS | Status: DC | PRN
  Administered 2023-01-20: 60 mg via INTRAVENOUS

## 2023-01-20 MED ORDER — ACETAMINOPHEN 500 MG PO TABS
ORAL_TABLET | ORAL | Status: AC
  Filled 2023-01-20: qty 2

## 2023-01-20 MED ORDER — PROPOFOL 10 MG/ML IV BOLUS
INTRAVENOUS | Status: AC
  Filled 2023-01-20: qty 20

## 2023-01-20 MED ORDER — CEFAZOLIN SODIUM-DEXTROSE 2-4 GM/100ML-% IV SOLN
2.0000 g | INTRAVENOUS | Status: AC
  Administered 2023-01-20: 2 g via INTRAVENOUS

## 2023-01-20 MED ORDER — OXYCODONE HCL 5 MG PO TABS: 5.0000 mg | ORAL_TABLET | Freq: Once | ORAL | Status: DC | PRN

## 2023-01-20 MED ORDER — ROCURONIUM BROMIDE 10 MG/ML (PF) SYRINGE
PREFILLED_SYRINGE | INTRAVENOUS | Status: AC
  Filled 2023-01-20: qty 10

## 2023-01-20 MED ORDER — LACTATED RINGERS IV SOLN: INTRAVENOUS | Status: DC

## 2023-01-20 MED ORDER — CHLORHEXIDINE GLUCONATE 0.12 % MT SOLN
OROMUCOSAL | Status: AC
  Filled 2023-01-20: qty 15

## 2023-01-20 MED ORDER — DOCUSATE SODIUM 100 MG PO CAPS: 100.0000 mg | ORAL_CAPSULE | Freq: Two times a day (BID) | ORAL | 0 refills | Status: AC | PRN

## 2023-01-20 MED ORDER — CHLORHEXIDINE GLUCONATE 0.12 % MT SOLN
15.0000 mL | Freq: Once | OROMUCOSAL | Status: AC
  Administered 2023-01-20: 15 mL via OROMUCOSAL

## 2023-01-20 MED ORDER — FENTANYL CITRATE (PF) 100 MCG/2ML IJ SOLN
INTRAMUSCULAR | Status: AC
  Filled 2023-01-20: qty 2

## 2023-01-20 SURGICAL SUPPLY — 45 items
ADH SKN CLS APL DERMABOND .7 (GAUZE/BANDAGES/DRESSINGS) ×1
APL PRP STRL LF DISP 70% ISPRP (MISCELLANEOUS) ×1
BLADE CLIPPER SURG (BLADE) ×2 IMPLANT
BLADE SURG 15 STRL LF DISP TIS (BLADE) ×2 IMPLANT
BLADE SURG 15 STRL SS (BLADE) ×1
CHLORAPREP W/TINT 26 (MISCELLANEOUS) ×2 IMPLANT
DERMABOND ADVANCED .7 DNX12 (GAUZE/BANDAGES/DRESSINGS) ×2 IMPLANT
DRAIN PENROSE 12X.25 LTX STRL (MISCELLANEOUS) ×2 IMPLANT
DRAPE LAPAROTOMY 100X77 ABD (DRAPES) ×2 IMPLANT
ELECT REM PT RETURN 9FT ADLT (ELECTROSURGICAL) ×1
ELECTRODE REM PT RTRN 9FT ADLT (ELECTROSURGICAL) ×2 IMPLANT
GAUZE 4X4 16PLY ~~LOC~~+RFID DBL (SPONGE) ×2 IMPLANT
GLOVE BIOGEL PI IND STRL 7.0 (GLOVE) ×2 IMPLANT
GLOVE SURG SYN 6.5 ES PF (GLOVE) ×2 IMPLANT
GLOVE SURG SYN 6.5 PF PI (GLOVE) ×4 IMPLANT
GOWN STRL REUS W/ TWL LRG LVL3 (GOWN DISPOSABLE) ×4 IMPLANT
GOWN STRL REUS W/TWL LRG LVL3 (GOWN DISPOSABLE) ×2
LABEL OR SOLS (LABEL) ×2 IMPLANT
MANIFOLD NEPTUNE II (INSTRUMENTS) ×2 IMPLANT
MESH HERNIA 6X12 ULTRAPRO MED (Mesh General) IMPLANT
MESH HERNIA 6X13 (Mesh General) IMPLANT
MESH MARLEX PLUG MEDIUM (Mesh General) IMPLANT
MESH PARIETEX PROGRIP LEFT (Mesh General) IMPLANT
MESH PARIETEX PROGRIP RIGHT (Mesh General) IMPLANT
MESH SYNTHETIC 1.8X4 KEYHOLE S (Mesh General) IMPLANT
NDL HYPO 22X1.5 SAFETY MO (MISCELLANEOUS) ×4 IMPLANT
NEEDLE HYPO 22X1.5 SAFETY MO (MISCELLANEOUS) ×2 IMPLANT
NS IRRIG 500ML POUR BTL (IV SOLUTION) ×2 IMPLANT
PACK BASIN MINOR ARMC (MISCELLANEOUS) ×2 IMPLANT
SUT ETHIBOND NAB MO 7 #0 18IN (SUTURE) ×2 IMPLANT
SUT MNCRL 4-0 (SUTURE) ×1
SUT MNCRL 4-0 27XMFL (SUTURE) ×1
SUT SILK 3 0 12 30 (SUTURE) IMPLANT
SUT SILK 3 0 SH 30 (SUTURE) IMPLANT
SUT VIC AB 2-0 CT2 27 (SUTURE) ×2 IMPLANT
SUT VIC AB 3-0 SH 27 (SUTURE) ×1
SUT VIC AB 3-0 SH 27X BRD (SUTURE) ×2 IMPLANT
SUTURE MNCRL 4-0 27XMF (SUTURE) ×2 IMPLANT
SYR 10ML LL (SYRINGE) ×2 IMPLANT
SYR 20ML LL LF (SYRINGE) ×2 IMPLANT
SYR BULB IRRIG 60ML STRL (SYRINGE) ×2 IMPLANT
TOWEL OR 17X26 4PK STRL BLUE (TOWEL DISPOSABLE) ×2 IMPLANT
TRAP FLUID SMOKE EVACUATOR (MISCELLANEOUS) ×2 IMPLANT
WATER STERILE IRR 1000ML POUR (IV SOLUTION) ×2 IMPLANT
WATER STERILE IRR 500ML POUR (IV SOLUTION) ×2 IMPLANT

## 2023-01-20 NOTE — Interval H&P Note (Signed)
No change. OK to proceed.

## 2023-01-20 NOTE — Anesthesia Postprocedure Evaluation (Signed)
Anesthesia Post Note  Patient: Elijah Nelson  Procedure(s) Performed: HERNIA REPAIR INGUINAL ADULT (Left: Inguinal)  Patient location during evaluation: PACU Anesthesia Type: General Level of consciousness: awake and alert Pain management: pain level controlled Vital Signs Assessment: post-procedure vital signs reviewed and stable Respiratory status: spontaneous breathing, nonlabored ventilation, respiratory function stable and patient connected to nasal cannula oxygen Cardiovascular status: blood pressure returned to baseline and stable Postop Assessment: no apparent nausea or vomiting Anesthetic complications: no  No notable events documented.   Last Vitals:  Vitals:   01/20/23 1123 01/20/23 1136  BP: 104/79 117/74  Pulse: 61 70  Resp: 12 18  Temp: 36.6 C (!) 36.1 C  SpO2: 96% 96%    Last Pain:  Vitals:   01/20/23 1136  TempSrc: Temporal  PainSc: 0-No pain                 Stephanie Coup

## 2023-01-20 NOTE — Transfer of Care (Signed)
Immediate Anesthesia Transfer of Care Note  Patient: Elijah Nelson  Procedure(s) Performed: HERNIA REPAIR INGUINAL ADULT (Left: Inguinal)  Patient Location: PACU  Anesthesia Type:General  Level of Consciousness: drowsy and patient cooperative  Airway & Oxygen Therapy: Patient Spontanous Breathing and Patient connected to face mask oxygen  Post-op Assessment: Report given to RN and Post -op Vital signs reviewed and stable  Post vital signs: Reviewed and stable  Last Vitals:  Vitals Value Taken Time  BP 127/76 01/20/23 1045  Temp 36.8 C 01/20/23 1045  Pulse 68 01/20/23 1052  Resp 11 01/20/23 1100  SpO2 91 % 01/20/23 1052  Vitals shown include unvalidated device data.  Last Pain:  Vitals:   01/20/23 1045  TempSrc:   PainSc: Asleep      Patients Stated Pain Goal: 0 (01/20/23 0810)  Complications: No notable events documented.

## 2023-01-20 NOTE — Anesthesia Procedure Notes (Signed)
Procedure Name: Intubation Date/Time: 01/20/2023 9:02 AM  Performed by: Omer Jack, CRNAPre-anesthesia Checklist: Patient identified, Patient being monitored, Timeout performed, Emergency Drugs available and Suction available Patient Re-evaluated:Patient Re-evaluated prior to induction Oxygen Delivery Method: Circle system utilized Preoxygenation: Pre-oxygenation with 100% oxygen Induction Type: IV induction Ventilation: Mask ventilation without difficulty Laryngoscope Size: Mac and 3 Grade View: Grade I Tube type: Oral Tube size: 7.0 mm Number of attempts: 1 Placement Confirmation: ETT inserted through vocal cords under direct vision, positive ETCO2 and breath sounds checked- equal and bilateral Secured at: 21 cm Tube secured with: Tape Dental Injury: Teeth and Oropharynx as per pre-operative assessment

## 2023-01-20 NOTE — Anesthesia Preprocedure Evaluation (Signed)
Anesthesia Evaluation  Patient identified by MRN, date of birth, ID band Patient awake    Reviewed: Allergy & Precautions, H&P , NPO status , Patient's Chart, lab work & pertinent test results, reviewed documented beta blocker date and time   History of Anesthesia Complications Negative for: history of anesthetic complications  Airway Mallampati: II  TM Distance: >3 FB Neck ROM: full    Dental no notable dental hx. (+) Dental Advidsory Given   Pulmonary neg pulmonary ROS, sleep apnea and Continuous Positive Airway Pressure Ventilation    Pulmonary exam normal breath sounds clear to auscultation       Cardiovascular Exercise Tolerance: Good hypertension, negative cardio ROS Normal cardiovascular exam+ dysrhythmias Atrial Fibrillation  Rhythm:regular Rate:Normal     Neuro/Psych  Headaches negative neurological ROS  negative psych ROS   GI/Hepatic negative GI ROS, Neg liver ROS,GERD  Medicated,,  Endo/Other  negative endocrine ROS    Renal/GU negative Renal ROS  negative genitourinary   Musculoskeletal  (+) Arthritis ,    Abdominal Normal abdominal exam  (+) - obese Abdomen: soft.   Peds  Hematology negative hematology ROS (+)   Anesthesia Other Findings Past Medical History: No date: Arachnoid cyst     Comment:  a.) left temporal and anterior parietal lobes - has been              evaluated by neurology - benign No date: Arthritis No date: Ascending aorta dilatation (HCC)     Comment:  a.) TTE 06/18/2021: 37 mm No date: Atrial fibrillation (HCC)     Comment:  a.) CHA2DS2VASc = 3 (age x2, HTN); b.) s/p LAA closure               (Watchman) 06/23/2021; c.) rate/rhythm maintained without              pharmacological intervention; no chronic anticoagulation No date: B12 deficiency No date: BPH (benign prostatic hyperplasia) No date: CAD (coronary artery disease) No date: Chronic headache No date: Diastolic  dysfunction     Comment:  a.) TTE 06/18/2021: EF >55%, mild LVH, mild MAC, mild               AoV sclerosis, mild panvalvular regurg, G1DD No date: Elevated PSA No date: Erectile dysfunction No date: GERD (gastroesophageal reflux disease) 07/2019: History of bilateral cataract extraction No date: History of skin cancer No date: HLD (hyperlipidemia) No date: Hypertension No date: Inguinal hernia No date: NSVT (nonsustained ventricular tachycardia) (HCC) No date: OSA on CPAP No date: Thrombocytopenia (HCC)  Past Surgical History: 07/23/2019: CATARACT EXTRACTION W/PHACO; Right     Comment:  Procedure: CATARACT EXTRACTION PHACO AND INTRAOCULAR               LENS PLACEMENT (IOC) RIGHT;  Surgeon: Galen Manila,               MD;  Location: [facility];  Service:               Ophthalmology;  Laterality: Right;  1:10 16.7% 11.84 08/13/2019: CATARACT EXTRACTION W/PHACO; Left     Comment:  Procedure: CATARACT EXTRACTION PHACO AND INTRAOCULAR               LENS PLACEMENT (IOC) LEFT 6.26  00:42.6;  Surgeon:               Galen Manila, MD;  Location: [facility];                Service:  Ophthalmology;  Laterality: Left;  sleep apnea No date: compund fracture right forearm No date: HERNIA REPAIR 06/23/2021: LEFT ATRIAL APPENDAGE OCCLUSION  BMI    Body Mass Index: 24.99 kg/m      Reproductive/Obstetrics negative OB ROS                             Anesthesia Physical Anesthesia Plan  ASA: 3  Anesthesia Plan: General ETT   Post-op Pain Management:    Induction: Intravenous  PONV Risk Score and Plan: 1 and Treatment may vary due to age or medical condition  Airway Management Planned: Oral ETT  Additional Equipment:   Intra-op Plan:   Post-operative Plan: Extubation in OR  Informed Consent: I have reviewed the patients History and Physical, chart, labs and discussed the procedure including the risks, benefits and alternatives  for the proposed anesthesia with the patient or authorized representative who has indicated his/her understanding and acceptance.     Dental Advisory Given  Plan Discussed with: Anesthesiologist, CRNA and Surgeon  Anesthesia Plan Comments: (Patient consented for risks of anesthesia including but not limited to:  - adverse reactions to medications - damage to eyes, teeth, lips or other oral mucosa - nerve damage due to positioning  - sore throat or hoarseness - Damage to heart, brain, nerves, lungs, other parts of body or loss of life  Patient voiced understanding.)        Anesthesia Quick Evaluation  Patient Active Problem List   Diagnosis Date Noted  . 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 Gallstones 07/24/2020  . Hypertriglyceridemia 07/24/2020  . ED (erectile dysfunction) 07/24/2020  . Thrombocytopenia (HCC) 07/24/2020  . Closed extraarticular fracture of distal end of right radius 02/26/2020  . Laceration of finger of right hand 02/26/2020  . Radial shaft fracture 02/09/2020  . Benign prostatic hyperplasia with lower urinary tract symptoms 06/18/2018  . History of elevated PSA 06/18/2018  . Atrial fibrillation with RVR (HCC) 09/18/2017  . Chest pain 09/18/2017  . GERD (gastroesophageal reflux disease) 09/18/2017  . OSA (obstructive sleep apnea) 09/18/2017  . B12 deficiency 12/05/2016  . Essential hypertension 12/05/2016  . Incomplete emptying of bladder 06/19/2014  . Reduced libido 08/30/2013  . 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 Elevated prostate specific antigen (PSA) 06/08/2012  . Urinary urgency 06/08/2012       Latest Ref Rng & Units 01/13/2023    8:54 AM 09/15/2021    1:29 AM 08/13/2020    3:40 PM  CBC  WBC 4.0 - 10.5 K/uL 5.6  11.8  7.1   Hemoglobin 13.0 - 17.0 g/dL 14.7  82.9  56.2   Hematocrit 39.0 - 52.0 % 42.6  38.7  37.9   Platelets 150 - 400 K/uL 158  161  142       Latest Ref Rng & Units 01/13/2023    8:54 AM 09/15/2021     1:29 AM 08/13/2020    3:40 PM  BMP  Glucose 70 - 99 mg/dL 92  130  865   BUN 8 - 23 mg/dL 21  15  20    Creatinine 0.61 - 1.24 mg/dL 7.84  6.96  2.95   Sodium 135 - 145 mmol/L 137  137  141   Potassium 3.5 - 5.1 mmol/L 3.4  2.8  3.7   Chloride 98 - 111 mmol/L 101  101  108   CO2 22 - 32 mmol/L 28  25  22  Calcium 8.9 - 10.3 mg/dL 9.3  9.3  8.5    Risks and benefits of anesthesia discussed at length, patient or surrogate demonstrates understanding. Appropriately NPO. Plan to proceed with anesthesia.  Corlis Leak, MD 01/20/23

## 2023-01-20 NOTE — Discharge Instructions (Addendum)
Hernia repair, Care After ?This sheet gives you information about how to care for yourself after your procedure. Your health care provider may also give you more specific instructions. If you have problems or questions, contact your health care provider. ?What can I expect after the procedure? ?After your procedure, it is common to have the following: ?Pain in your abdomen, especially in the incision areas. You will be given medicine to control the pain. ?Tiredness. This is a normal part of the recovery process. Your energy level will return to normal over the next several weeks. ?Changes in your bowel movements, such as constipation or needing to go more often. Talk with your health care provider about how to manage this. ?Follow these instructions at home: ?Medicines ?RESUME ASPIRIN IN 48HRS  ?tylenol and advil as needed for discomfort.  Please alternate between the two every four hours as needed for pain.   ? Use narcotics, if prescribed, only when tylenol and motrin is not enough to control pain. ? 325-650mg every 8hrs to max of 3000mg/24hrs (including the 325mg in every norco dose) for the tylenol.   ? Advil up to 800mg per dose every 8hrs as needed for pain.   ?PLEASE RECORD NUMBER OF PILLS TAKEN UNTIL NEXT FOLLOW UP APPT.  THIS WILL HELP DETERMINE HOW READY YOU ARE TO BE RELEASED FROM ANY ACTIVITY RESTRICTIONS ?Do not drive or use heavy machinery while taking prescription pain medicine. ?Do not drink alcohol while taking prescription pain medicine. ? ?Incision care ? ?  ?Follow instructions from your health care provider about how to take care of your incision areas. Make sure you: ?Keep your incisions clean and dry. ?Wash your hands with soap and water before and after applying medicine to the areas, and before and after changing your bandage (dressing). If soap and water are not available, use hand sanitizer. ?Change your dressing as told by your health care provider. ?Leave stitches (sutures), skin glue,  or adhesive strips in place. These skin closures may need to stay in place for 2 weeks or longer. If adhesive strip edges start to loosen and curl up, you may trim the loose edges. Do not remove adhesive strips completely unless your health care provider tells you to do that. ?Do not wear tight clothing over the incisions. Tight clothing may rub and irritate the incision areas, which may cause the incisions to open. ?Do not take baths, swim, or use a hot tub until your health care provider approves. OK TO SHOWER IN 24HRS.   ?Check your incision area every day for signs of infection. Check for: ?More redness, swelling, or pain. ?More fluid or blood. ?Warmth. ?Pus or a bad smell. ?Activity ?Avoid lifting anything that is heavier than 10 lb (4.5 kg) for 2 weeks or until your health care provider says it is okay. ?No pushing/pulling greater than 30lbs ?You may resume normal activities as told by your health care provider. Ask your health care provider what activities are safe for you. ?Take rest breaks during the day as needed. ?Eating and drinking ?Follow instructions from your health care provider about what you can eat after surgery. ?To prevent or treat constipation while you are taking prescription pain medicine, your health care provider may recommend that you: ?Drink enough fluid to keep your urine clear or pale yellow. ?Take over-the-counter or prescription medicines. ?Eat foods that are high in fiber, such as fresh fruits and vegetables, whole grains, and beans. ?Limit foods that are high in fat and processed sugars,   such as fried and sweet foods. ?General instructions ?Ask your health care provider when you will need an appointment to get your sutures or staples removed. ?Keep all follow-up visits as told by your health care provider. This is important. ?Contact a health care provider if: ?You have more redness, swelling, or pain around your incisions. ?You have more fluid or blood coming from the  incisions. ?Your incisions feel warm to the touch. ?You have pus or a bad smell coming from your incisions or your dressing. ?You have a fever. ?You have an incision that breaks open (edges not staying together) after sutures or staples have been removed. ?You develop a rash. ?You have chest pain or difficulty breathing. ?You have pain or swelling in your legs. ?You feel light-headed or you faint. ?Your abdomen swells (becomes distended). ?You have nausea or vomiting. ?You have blood in your stool (feces). ?This information is not intended to replace advice given to you by your health care provider. Make sure you discuss any questions you have with your health care provider. ?Document Released: 03/25/2005 Document Revised: 05/25/2018 Document Reviewed: 06/06/2016 ?Elsevier Interactive Patient Education ? 2019 [employer] ?  ? ?AMBULATORY SURGERY  ?DISCHARGE INSTRUCTIONS ? ? ?The drugs that you were given will stay in your system until tomorrow so for the next 24 hours you should not: ? ?Drive an automobile ?Make any legal decisions ?Drink any alcoholic beverage ? ? ?You may resume regular meals tomorrow.  Today it is better to start with liquids and gradually work up to solid foods. ? ?You may eat anything you prefer, but it is better to start with liquids, then soup and crackers, and gradually work up to solid foods. ? ? ?Please notify your doctor immediately if you have any unusual bleeding, trouble breathing, redness and pain at the surgery site, drainage, fever, or pain not relieved by medication. ? ? ? ?Additional Instructions: ? ? ? ? ? ? ? ?Please contact your physician with any problems or Same Day Surgery at [phone], Monday through Friday 6 am to 4 pm, or [place] [facility] [place] Main number at [phone].  ? ?

## 2023-01-20 NOTE — Op Note (Signed)
Preoperative diagnosis: Left recurrent reducible inguinal Hernia.  Postoperative diagnosis: Left inguinal Hernia  Procedure:  Open left inguinal hernia repair with mesh  Anesthesia: General, LMA  Surgeon: Dr. Tonna Boehringer  Wound Classification: Clean  Specimen: none  Complications: None  Estimated Blood Loss: 10mL   Indications:  Patient is a 81 y.o. male developed a symptomatic left inguinal hernia. Repair was indicated to avoid complications of incarceration, obstruction and pain, and a prosthetic mesh repair was elected.  Findings: 1. Vas Deferens and cord structures identified and preserved 2. Progrip mesh used for repair 3. Adequate hemostasis achieved  Description of procedure: The patient was taken to the operating room. A time-out was completed verifying correct patient, procedure, site, positioning, and implant(s) and/or special equipment prior to beginning this procedure. The left groin was prepped and draped in the usual sterile fashion. An incision was marked in a natural skin crease and planned to end near the pubic tubercle.  The skin crease incision was made with a knife and deepened through Scarpa's and Camper's fascia with electrocautery until the aponeurosis of the external oblique was encountered. This was cleaned and the external ring was exposed. Hemostasis was achieved in the wound. An incision was made in the midportion of the external oblique aponeurosis in the direction of its fibers. The ilioinguinal nerve was identified and protected throughout the dissection. Flaps of the external oblique were developed cephalad and inferiorly.  The cord was identified. It was gently dissected free at the pubic tubercle and encircled with a Penrose drain. Attention was directed to the anteromedial aspect of the cord, where an indirect hernia sac was identified. The sac was carefully dissected free of the cord down to the level of the internal ring and reduced back into abdominal  cavity along with an adjacent cord lipoma. The vas and testicular vessels were identified and protected from harm.   Attention then turned to the floor of the canal, which was intact. The Progrip mesh was inserted and secured to the pubic tubercle using interrupted 0 ethibond sutures. Care was taken to assure that the mesh was placed flat against the floor and wrapped loosely around the cord structures.  Hemostasis was again checked. The Penrose drain was removed. Exparel infused as an ilioinguinal block.  The external oblique aponeurosis was closed with a running suture of 2-0 Vicryl, taking care not to catch the ilioinguinal nerve in the suture line. Scarpa's fascia was closed with interrupted 3-0 Vicryl.  The deep dermal layer closed with interrupted 3-0 Vicryl.  The skin was closed with a subcuticular stitch of Monocryl 4-0. Dermabond was applied.  The testis was gently pulled down into its anatomic position in the scrotum.  The patient tolerated the procedure well and was taken to the postanesthesia care unit in stable condition. Sponge and instrument count correct at end of procedure.
